# Patient Record
Sex: Male | Born: 1965 | Race: White | Hispanic: No | Marital: Single | Smoking: Never smoker
Health system: Southern US, Community
[De-identification: ages and names within clinical notes are randomized; demographics above are authoritative.]

## PROBLEM LIST (undated history)

## (undated) DIAGNOSIS — T7840XA Allergy, unspecified, initial encounter: Secondary | ICD-10-CM

## (undated) DIAGNOSIS — C801 Malignant (primary) neoplasm, unspecified: Secondary | ICD-10-CM

## (undated) DIAGNOSIS — K219 Gastro-esophageal reflux disease without esophagitis: Secondary | ICD-10-CM

## (undated) HISTORY — PX: EYE SURGERY: SHX253

## (undated) HISTORY — DX: Gastro-esophageal reflux disease without esophagitis: K21.9

## (undated) HISTORY — DX: Allergy, unspecified, initial encounter: T78.40XA

## (undated) HISTORY — DX: Malignant (primary) neoplasm, unspecified: C80.1

---

## 2018-04-26 ENCOUNTER — Emergency Department (HOSPITAL_BASED_OUTPATIENT_CLINIC_OR_DEPARTMENT_OTHER): Payer: 59

## 2018-04-26 ENCOUNTER — Emergency Department (HOSPITAL_BASED_OUTPATIENT_CLINIC_OR_DEPARTMENT_OTHER)
Admission: EM | Admit: 2018-04-26 | Discharge: 2018-04-26 | Disposition: A | Discharge status: Home / Self Care | Payer: 59 | Attending: Emergency Medicine | Admitting: Emergency Medicine

## 2018-04-26 ENCOUNTER — Encounter (HOSPITAL_BASED_OUTPATIENT_CLINIC_OR_DEPARTMENT_OTHER): Payer: Self-pay | Admitting: Emergency Medicine

## 2018-04-26 ENCOUNTER — Other Ambulatory Visit: Payer: Self-pay

## 2018-04-26 DIAGNOSIS — R1030 Lower abdominal pain, unspecified: Secondary | ICD-10-CM | POA: Diagnosis not present

## 2018-04-26 DIAGNOSIS — M545 Low back pain, unspecified: Secondary | ICD-10-CM

## 2018-04-26 DIAGNOSIS — F1210 Cannabis abuse, uncomplicated: Secondary | ICD-10-CM | POA: Diagnosis not present

## 2018-04-26 DIAGNOSIS — M5126 Other intervertebral disc displacement, lumbar region: Secondary | ICD-10-CM | POA: Insufficient documentation

## 2018-04-26 LAB — BASIC METABOLIC PANEL
Anion gap: 9 (ref 5–15)
BUN: 9 mg/dL (ref 6–20)
CO2: 23 mmol/L (ref 22–32)
Calcium: 9.1 mg/dL (ref 8.9–10.3)
Chloride: 105 mmol/L (ref 98–111)
Creatinine, Ser: 0.88 mg/dL (ref 0.61–1.24)
GFR calc Af Amer: >60 mL/min (ref >60)
GFR calc non Af Amer: >60 mL/min (ref >60)
Glucose, Bld: 86 mg/dL (ref 70–99)
Potassium: 3.7 mmol/L (ref 3.5–5.1)
Sodium: 137 mmol/L (ref 135–145)

## 2018-04-26 LAB — CBC WITH DIFFERENTIAL/PLATELET
Abs Immature Granulocytes: 0.04 10*3/uL (ref 0.00–0.07)
Basophils Absolute: 0.1 10*3/uL (ref 0.0–0.1)
Basophils Relative: 1 %
Eosinophils Absolute: 0 10*3/uL (ref 0.0–0.5)
Eosinophils Relative: 0 %
HCT: 47.2 % (ref 39.0–52.0)
Hemoglobin: 15.4 g/dL (ref 13.0–17.0)
Immature Granulocytes: 1 %
Lymphocytes Relative: 11 %
Lymphs Abs: 0.9 10*3/uL (ref 0.7–4.0)
MCH: 30.1 pg (ref 26.0–34.0)
MCHC: 32.6 g/dL (ref 30.0–36.0)
MCV: 92.4 fL (ref 80.0–100.0)
Monocytes Absolute: 1.1 10*3/uL — ABNORMAL HIGH (ref 0.1–1.0)
Monocytes Relative: 13 %
Neutro Abs: 5.9 10*3/uL (ref 1.7–7.7)
Neutrophils Relative %: 74 %
Platelets: 246 10*3/uL (ref 150–400)
RBC: 5.11 MIL/uL (ref 4.22–5.81)
RDW: 12.2 % (ref 11.5–15.5)
WBC: 8 10*3/uL (ref 4.0–10.5)
nRBC: 0 % (ref 0.0–0.2)

## 2018-04-26 LAB — URINALYSIS, ROUTINE W REFLEX MICROSCOPIC
Bilirubin Urine: NEGATIVE
Glucose, UA: NEGATIVE mg/dL
HGB URINE DIPSTICK: NEGATIVE
Ketones, ur: NEGATIVE mg/dL
Leukocytes, UA: NEGATIVE
Nitrite: NEGATIVE
Protein, ur: NEGATIVE mg/dL
Specific Gravity, Urine: <1.005 — ABNORMAL LOW (ref 1.005–1.030)
pH: 5.5 (ref 5.0–8.0)

## 2018-04-26 MED ORDER — IBUPROFEN 600 MG PO TABS: 600.0000 mg | ORAL_TABLET | Freq: Four times a day (QID) | ORAL | 0 refills | Status: DC | PRN

## 2018-04-26 MED ORDER — METHOCARBAMOL 750 MG PO TABS: 750.0000 mg | ORAL_TABLET | Freq: Two times a day (BID) | ORAL | 0 refills | Status: DC

## 2018-04-26 NOTE — ED Triage Notes (Signed)
Pain in right flank radiating into right groin and down right leg since last night.  Has been having occasional twinges of pain in right flank for a couple months, but nothing like this.

## 2018-04-26 NOTE — ED Notes (Signed)
Patient transported to CT 

## 2018-04-26 NOTE — Discharge Instructions (Addendum)
Take ibuprofen every 6 hours for your pain.  You can alternate with Tylenol as prescribed over-the-counter.  Take Robaxin twice daily as needed for muscle pain or spasms.  Do not drive or operate machinery while taking this medication.  Use ice 3-4 times daily alternating 20 minutes on, 20 minutes off.  Please follow-up with a primary care provider by calling the number circled on your discharge paperwork.  I have also attached to a neurosurgeon to follow-up with if your symptoms are continuing.

## 2018-04-26 NOTE — ED Provider Notes (Signed)
[facility] Provider Note   CSN: [id number] Arrival date & time: 04/26/18  1042     History   Chief Complaint Chief Complaint  Patient presents with  . flank and groin pain    HPI Johnny Cook is a 53 y.o. male who is previously healthy who presents with right low back pain that began last night.  It woke him up out of sleep.  He reports it is worse when he lays on his right side or make certain movements.  He has radiation into his right groin and down the front of his right leg toward his knee.  He reports he has had occasional twinges of pain in his right low back for couple months, but nothing like this.  He reports doing heavy lifting at work.  He denies any numbness or tingling, saddle anesthesia, bowel or bladder incontinence, fevers, history of cancer, procedure to back, IVDU.  He has not taken any medications at home for symptoms.  HPI  History reviewed. No pertinent past medical history.  There are no active problems to display for this patient.   History reviewed. No pertinent surgical history.      Home Medications    Prior to Admission medications   Medication Sig Start Date End Date Taking? Authorizing Provider  ibuprofen (ADVIL,MOTRIN) 600 MG tablet Take 1 tablet (600 mg total) by mouth every 6 (six) hours as needed. 04/26/18   Rahim Astorga, Bea Graff, PA-C  methocarbamol (ROBAXIN) 750 MG tablet Take 1 tablet (750 mg total) by mouth 2 (two) times daily. 04/26/18   Frederica Kuster, PA-C    Family History No family history on file.  Social History Social History   Tobacco Use  . Smoking status: Never Smoker  . Smokeless tobacco: Never Used  Substance Use Topics  . Alcohol use: Yes    Comment: 3-4 beers per day  . Drug use: Yes    Types: Marijuana     Allergies   Patient has no known allergies.   Review of Systems Review of Systems  Constitutional: Negative for chills and fever.  HENT: Negative for facial swelling  and sore throat.   Respiratory: Negative for shortness of breath.   Cardiovascular: Negative for chest pain.  Gastrointestinal: Negative for abdominal pain, nausea and vomiting.  Genitourinary: Negative for discharge, dysuria, frequency, hematuria, penile pain, scrotal swelling, testicular pain and urgency.  Musculoskeletal: Positive for back pain.  Skin: Negative for rash and wound.  Neurological: Negative for numbness and headaches.  Psychiatric/Behavioral: The patient is not nervous/anxious.      Physical Exam Updated Vital Signs BP (!) 141/98 (BP Location: Left Arm)   Pulse 74   Temp 98.2 F (36.8 C) (Oral)   Resp 16   Ht 5\' 9"  (1.753 m)   Wt 74.8 kg   SpO2 98%   BMI 24.37 kg/m   Physical Exam Vitals signs and nursing note reviewed. Exam conducted with a chaperone present.  Constitutional:      General: He is not in acute distress.    Appearance: He is well-developed. He is not diaphoretic.  HENT:     Head: Normocephalic and atraumatic.     Mouth/Throat:     Pharynx: No oropharyngeal exudate.  Eyes:     General: No scleral icterus.       Right eye: No discharge.        Left eye: No discharge.     Conjunctiva/sclera: Conjunctivae normal.  Pupils: Pupils are equal, round, and reactive to light.  Neck:     Musculoskeletal: Normal range of motion and neck supple.     Thyroid: No thyromegaly.  Cardiovascular:     Rate and Rhythm: Normal rate and regular rhythm.     Heart sounds: Normal heart sounds. No murmur. No friction rub. No gallop.   Pulmonary:     Effort: Pulmonary effort is normal. No respiratory distress.     Breath sounds: Normal breath sounds. No stridor. No wheezing or rales.  Abdominal:     General: Bowel sounds are normal. There is no distension.     Palpations: Abdomen is soft. There is no pulsatile mass.     Tenderness: There is no abdominal tenderness. There is no right CVA tenderness, left CVA tenderness, guarding or rebound.  Genitourinary:     Penis: Normal.      Scrotum/Testes: Normal.  Musculoskeletal:     Comments: No tenderness on palpation of the cervical, thoracic, lumbar spine, surrounding musculature, right hip, or leg  Lymphadenopathy:     Cervical: No cervical adenopathy.  Skin:    General: Skin is warm and dry.     Coloration: Skin is not pale.     Findings: No rash.  Neurological:     Mental Status: He is alert.     Coordination: Coordination normal.     Comments: Sensation intact, 5/5 strength to bilateral lower extremities, 2+ patellar reflexes      ED Treatments / Results  Labs (all labs ordered are listed, but only abnormal results are displayed) Labs Reviewed  URINALYSIS, ROUTINE W REFLEX MICROSCOPIC - Abnormal; Notable for the following components:      Result Value   Color, Urine STRAW (*)    Specific Gravity, Urine <1.005 (*)    All other components within normal limits  CBC WITH DIFFERENTIAL/PLATELET - Abnormal; Notable for the following components:   Monocytes Absolute 1.1 (*)    All other components within normal limits  BASIC METABOLIC PANEL    EKG None  Radiology Ct L-spine No Charge  Result Date: 04/26/2018 CLINICAL DATA:  Right low back pain radiating to the groin and right leg. EXAM: CT LUMBAR SPINE WITHOUT CONTRAST TECHNIQUE: Multidetector CT imaging of the lumbar spine was performed without intravenous contrast administration. Multiplanar CT image reconstructions were also generated. COMPARISON:  None. FINDINGS: Segmentation: 5 lumbar type vertebral bodies. Alignment: Degenerative anterolisthesis at L5-S1 of 5-6 mm. Vertebrae: No fracture or primary bone lesion. Paraspinal and other soft tissues: Aortic atherosclerosis. Disc levels: No significant finding at L2-3 or above. L3-4: Mild bulging of the disc.  No likely compressive stenosis. L4-5: Bulging of the disc with prominence in the right foraminal to extraforaminal region. There is some potential this could affect the right L4 nerve.  L5-S1: Chronic facet arthropathy with anterolisthesis 5-6 mm. Chronic disc degeneration with loss of disc height and vacuum phenomenon. Stenosis of the subarticular lateral recesses and neural foramina that could cause neural compression on either or both sides. IMPRESSION: Note that the sagittal images go left to right. This could lead to left-to-right confusion. Right foraminal to extraforaminal disc protrusion at L4-5 that could focally affect the right L4 nerve. L5-S1 bilateral facet arthropathy with degenerative anterolisthesis of 5-6 mm. Disc degeneration and bulging. Stenosis of the subarticular lateral recesses and neural foramina that could cause L5 or S1 nerve compression on either side. Electronically Signed   By: Nelson Chimes M.D.   On: 04/26/2018 14:47  Ct Renal Stone Study  Result Date: 04/26/2018 CLINICAL DATA:  Acute right flank pain. EXAM: CT ABDOMEN AND PELVIS WITHOUT CONTRAST TECHNIQUE: Multidetector CT imaging of the abdomen and pelvis was performed following the standard protocol without IV contrast. COMPARISON:  None. FINDINGS: Lower chest: No acute abnormality. Hepatobiliary: No gallstones or biliary dilatation is noted. Fatty infiltration of the liver is noted. Pancreas: Unremarkable. No pancreatic ductal dilatation or surrounding inflammatory changes. Spleen: Normal in size without focal abnormality. Adrenals/Urinary Tract: Adrenal glands are unremarkable. Kidneys are normal, without renal calculi, focal lesion, or hydronephrosis. Bladder is unremarkable. Stomach/Bowel: Stomach is within normal limits. Appendix appears normal. No evidence of bowel wall thickening, distention, or inflammatory changes. Vascular/Lymphatic: Aortic atherosclerosis. No enlarged abdominal or pelvic lymph nodes. Reproductive: Prostate is unremarkable. Other: No abdominal wall hernia or abnormality. No abdominopelvic ascites. Musculoskeletal: No acute or significant osseous findings. IMPRESSION: Hepatic  steatosis. No hydronephrosis or renal obstruction is noted. No renal or ureteral calculi are noted. Aortic Atherosclerosis (ICD10-I70.0). Electronically Signed   By: Marijo Conception, M.D.   On: 04/26/2018 12:26    Procedures Procedures (including critical care time)  Medications Ordered in ED Medications - No data to display   Initial Impression / Assessment and Plan / ED Course  I have reviewed the triage vital signs and the nursing notes.  Pertinent labs & imaging results that were available during my care of the patient were reviewed by me and considered in my medical decision making (see chart for details).     Patient presenting with probable radicular back pain.  I am unable to reproduce the pain, however he does not currently have significant pain on my exam.  CT renal stone is negative for acute findings. Patient made aware of his hepatic steatosis and aortic atherosclerosis.   CT lumbar reformatting shows right foraminal to extraforaminal disc protrusion at L4-5 that could focally affect the right L4 nerve; L5-S1 bilateral facet arthropathy with degenerative anterolisthesis of 5 to 6 mm; disc degeneration and bulging; stenosis of the subarticular lateral recesses and neural foramina that could cause L5 or S1 nerve compression on either side.  Patient is neurovascularly intact.  He has had minimal pain during his ED course.  It is only worse with certain movements and positions.  Patient has no abdominal tenderness.  No pulsatile mass noted.  Labs are unremarkable.  Patient will be sent home with NSAIDs, Robaxin.  He is advised to follow-up with a PCP.  He will also given follow-up to neurosurgery as needed.  Advised ice and heat.  Strict return precautions discussed.  Patient understands and agrees with plan.  Patient vitals stable throughout ED course and discharged in satisfactory condition. I discussed patient case with Dr. Sherry Ruffing who guided the patient's management and agrees with  plan.   Final Clinical Impressions(s) / ED Diagnoses   Final diagnoses:  Low back pain  Protrusion of lumbar intervertebral disc    ED Discharge Orders         Ordered    ibuprofen (ADVIL,MOTRIN) 600 MG tablet  Every 6 hours PRN     04/26/18 1510    methocarbamol (ROBAXIN) 750 MG tablet  2 times daily     04/26/18 [address] [place] 04/26/18 2010    Tegeler, Gwenyth Allegra, MD 04/27/18 [phone]

## 2018-12-05 ENCOUNTER — Other Ambulatory Visit: Payer: Self-pay

## 2018-12-05 DIAGNOSIS — Z20822 Contact with and (suspected) exposure to covid-19: Secondary | ICD-10-CM

## 2018-12-06 LAB — NOVEL CORONAVIRUS, NAA: SARS-CoV-2, NAA: NOT DETECTED

## 2020-06-18 IMAGING — CT CT L SPINE W/O CM
4 of 8 series · 14 of 33 positions shown, 15 images · non-contrast
Comparison: None.

CLINICAL DATA: Right low back pain radiating to the groin and right
leg.

EXAM:
CT LUMBAR SPINE WITHOUT CONTRAST
TECHNIQUE: Multidetector CT imaging of the lumbar spine was performed without
intravenous contrast administration. Multiplanar CT image
reconstructions were also generated.

[Series 2: axial st · axial · 0.81mm/px · z∈[-452,-192]mm · 3 of 106 slices shown, 4 images]
[im 27/106  soft-tissue]
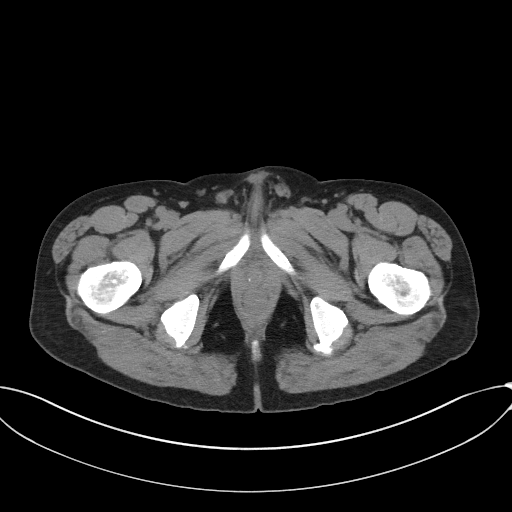
[im 27/106  bone]
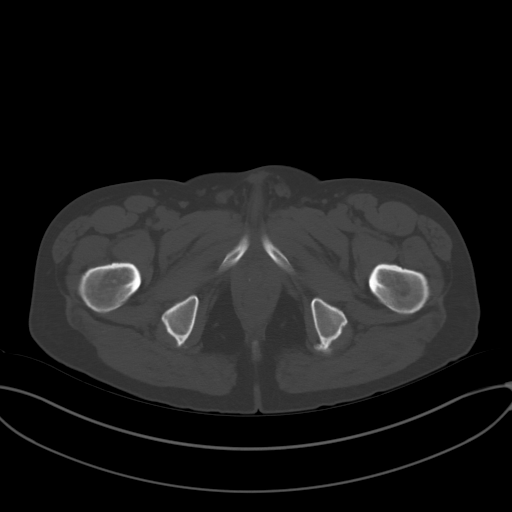
[im 53/106  bone]
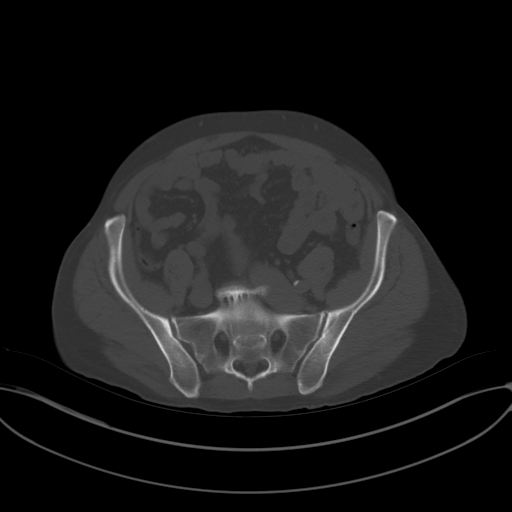
[im 79/106  bone]
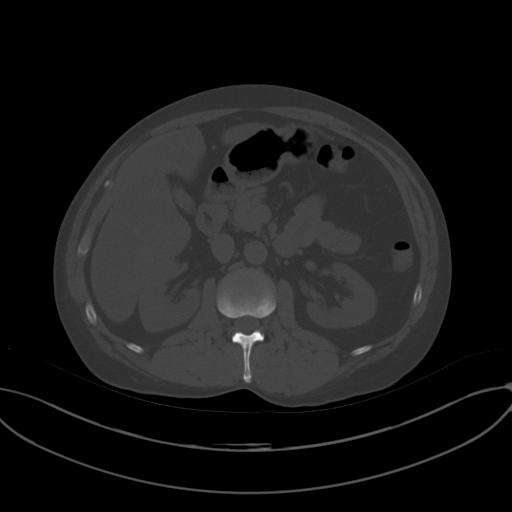

[Series 4: coronal st · coronal · 0.81mm/px · 2 of 89 slices shown]
[im 30/89  bone]
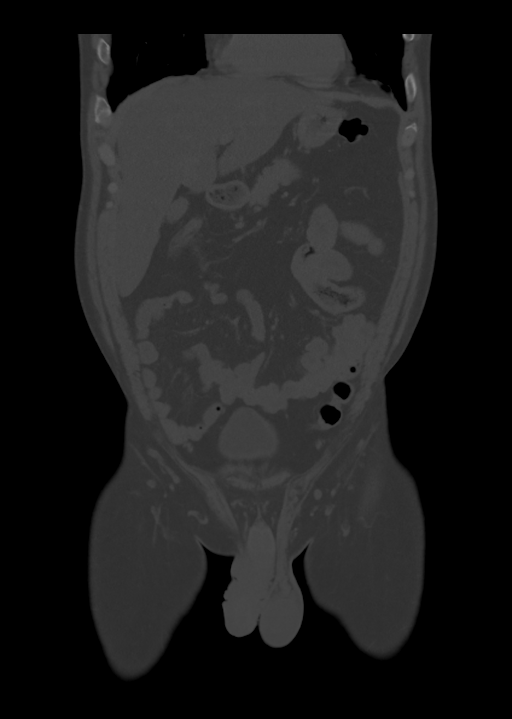
[im 59/89  bone]
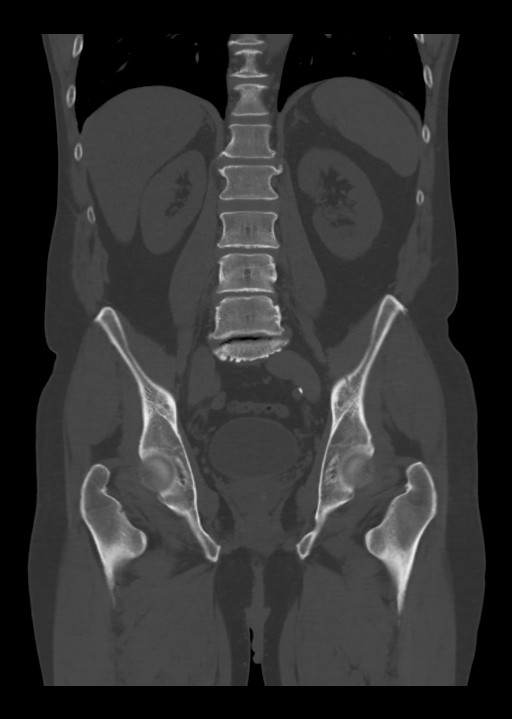

[Series 5: sagittal st · sagittal · 0.63mm/px · 5 of 117 slices shown]
[im 17/117  bone]
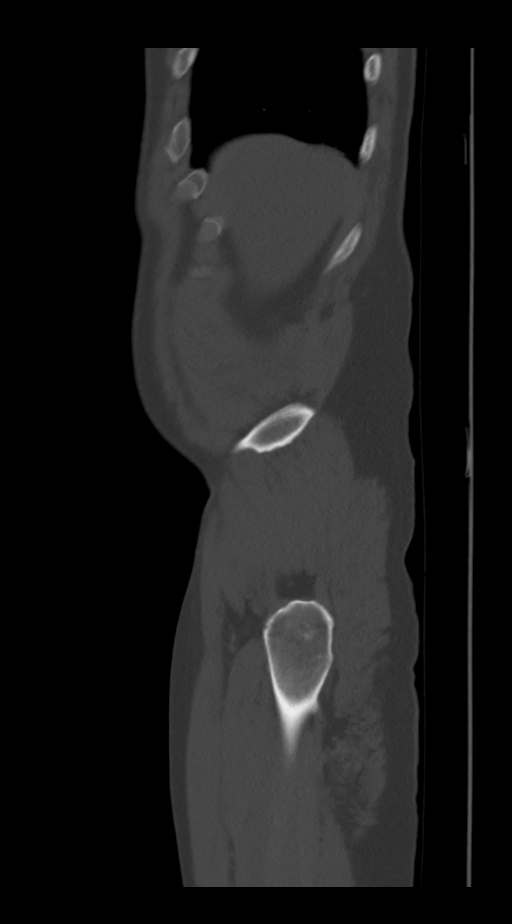
[im 34/117  bone]
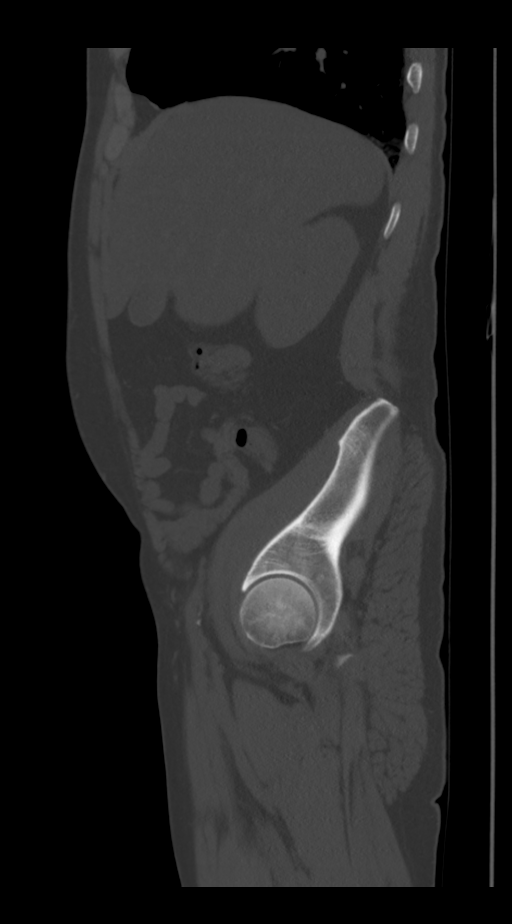
[im 50/117  bone]
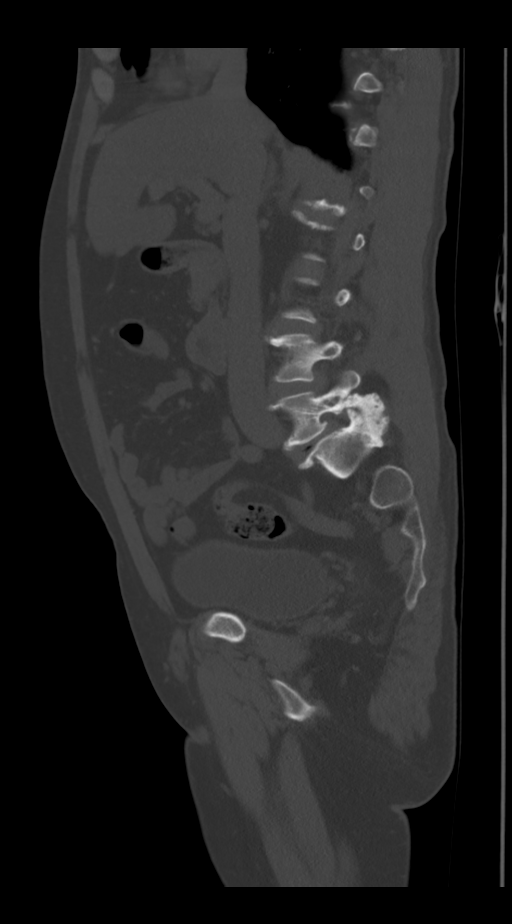
[im 67/117  bone]
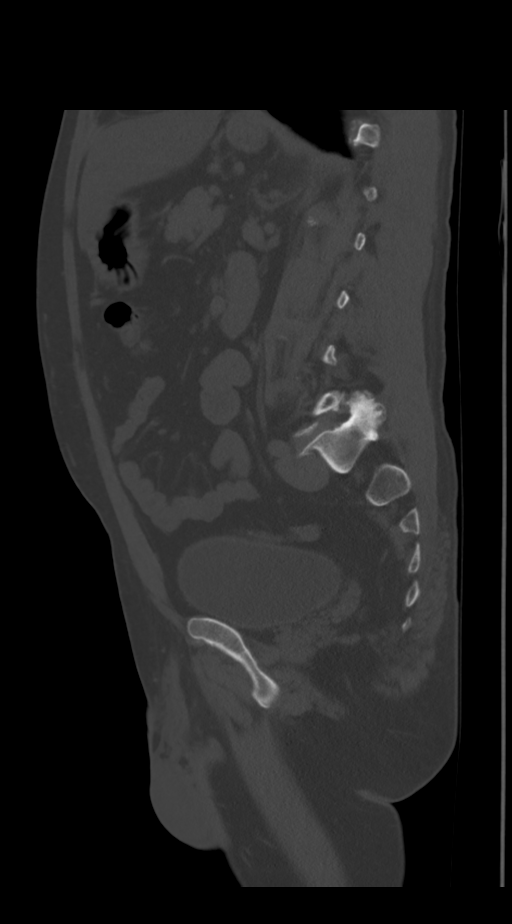
[im 83/117  bone]
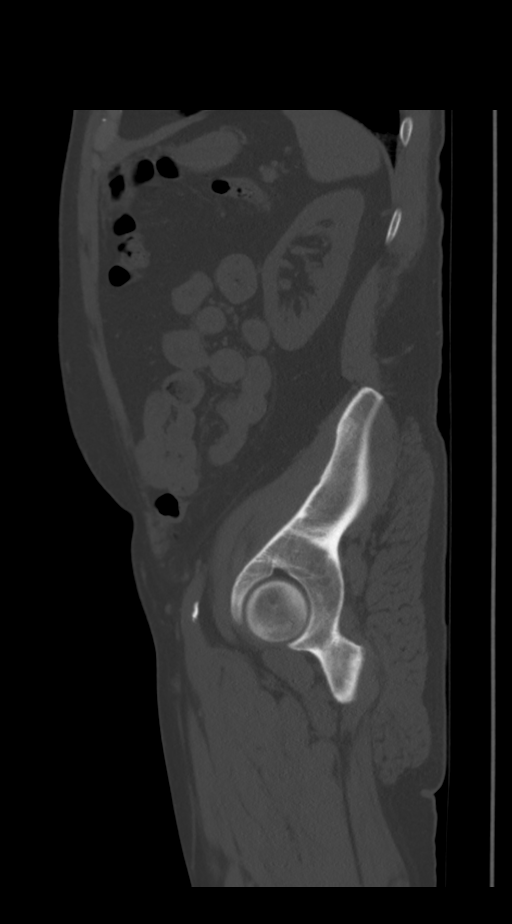

[Series 12: axial soft l/sp · axial · 0.30mm/px · z∈[-330,-156]mm · 4 of 147 slices shown]
[im 30/147  soft-tissue]
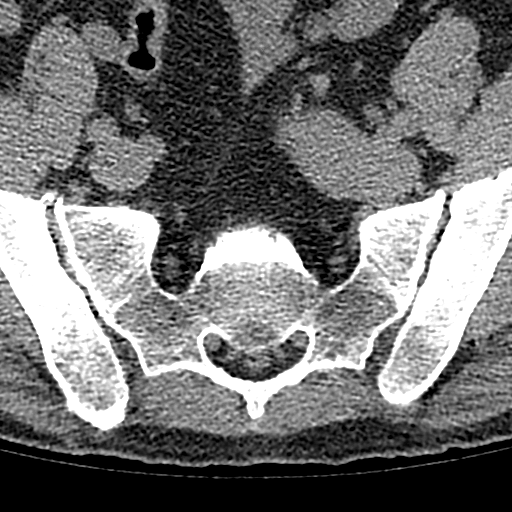
[im 59/147  soft-tissue]
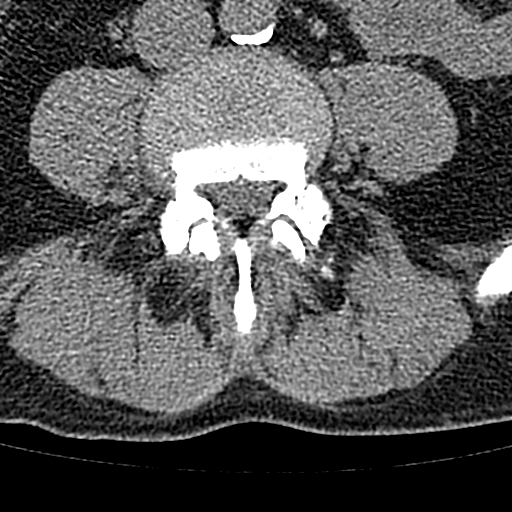
[im 88/147  soft-tissue]
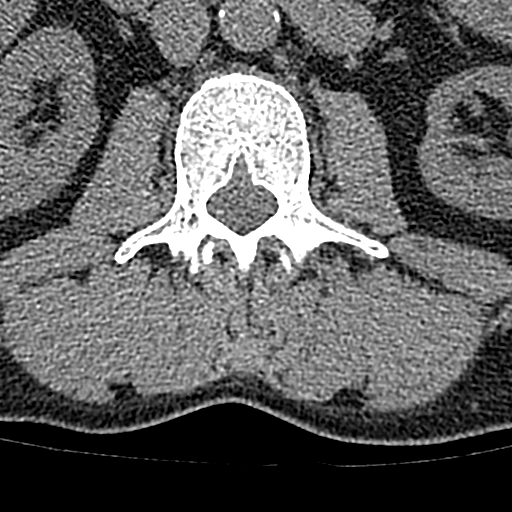
[im 117/147  soft-tissue]
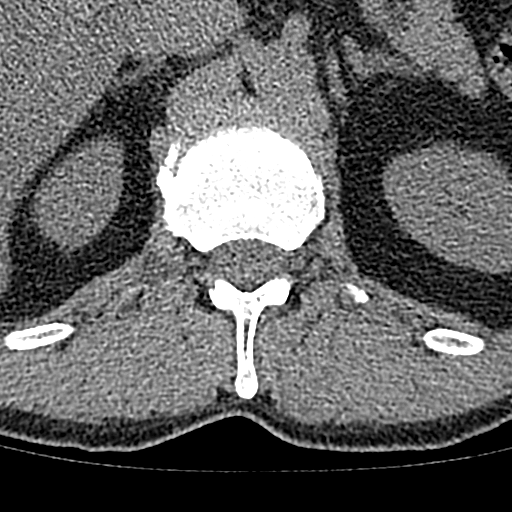

[14 of 33 positions shown; findings below may reference images not displayed]

FINDINGS: Segmentation: 5 lumbar type vertebral bodies.

Alignment: Degenerative anterolisthesis at L5-S1 of 5-6 mm.

Vertebrae: No fracture or primary bone lesion.

Paraspinal and other soft tissues: Aortic atherosclerosis.

Disc levels: No significant finding at L2-3 or above.

L3-4: Mild bulging of the disc.  No likely compressive stenosis.

L4-5: Bulging of the disc with prominence in the right foraminal to
extraforaminal region. There is some potential this could affect the
right L4 nerve.

L5-S1: Chronic facet arthropathy with anterolisthesis 5-6 mm.
Chronic disc degeneration with loss of disc height and vacuum
phenomenon. Stenosis of the subarticular lateral recesses and neural
foramina that could cause neural compression on either or both
sides.
IMPRESSION: Note that the sagittal images go left to right. This could lead to
left-to-right confusion.

Right foraminal to extraforaminal disc protrusion at L4-5 that could
focally affect the right L4 nerve.

L5-S1 bilateral facet arthropathy with degenerative anterolisthesis
of 5-6 mm. Disc degeneration and bulging. Stenosis of the
subarticular lateral recesses and neural foramina that could cause
L5 or S1 nerve compression on either side.

## 2021-03-27 ENCOUNTER — Encounter: Payer: Self-pay | Admitting: Emergency Medicine

## 2021-03-27 ENCOUNTER — Ambulatory Visit (INDEPENDENT_AMBULATORY_CARE_PROVIDER_SITE_OTHER): Payer: BC Managed Care – PPO

## 2021-03-27 ENCOUNTER — Other Ambulatory Visit: Payer: Self-pay

## 2021-03-27 ENCOUNTER — Ambulatory Visit
Admission: EM | Admit: 2021-03-27 | Discharge: 2021-03-27 | Disposition: A | Discharge status: Home / Self Care | Payer: BC Managed Care – PPO | Attending: Physician Assistant | Admitting: Physician Assistant

## 2021-03-27 DIAGNOSIS — M19072 Primary osteoarthritis, left ankle and foot: Secondary | ICD-10-CM | POA: Diagnosis not present

## 2021-03-27 DIAGNOSIS — M79672 Pain in left foot: Secondary | ICD-10-CM

## 2021-03-27 LAB — POCT FASTING CBG KUC MANUAL ENTRY: POCT Glucose (KUC): 124 mg/dL — AB (ref 70–99)

## 2021-03-27 MED ORDER — PREDNISONE 20 MG PO TABS: 40.0000 mg | ORAL_TABLET | Freq: Every day | ORAL | 0 refills | Status: AC

## 2021-03-27 NOTE — ED Triage Notes (Signed)
Patient c/o left foot pain/numbness x 2 weeks, no apparent injury.  No history of diabetes.

## 2021-03-27 NOTE — ED Provider Notes (Signed)
[facility]    CSN: [id number] Arrival date & time: 03/27/21  1530      History   Chief Complaint Chief Complaint  Patient presents with   Foot Pain    HPI GRAM Johnny Cook is a 55 y.o. male.   Patient here today for evaluation of pain in his left foot that started about 2 weeks ago.  He states that weightbearing will make pain worse.  Sometimes he will have some numbness that occurs in his foot as well but this is not constant.  He has not had any injury to his foot that he is aware of.  He does report a fracture to same foot when he was in high school.   The history is provided by the patient.  Foot Pain Pertinent negatives include no shortness of breath.   History reviewed. No pertinent past medical history.  There are no problems to display for this patient.   History reviewed. No pertinent surgical history.     Home Medications    Prior to Admission medications   Medication Sig Start Date End Date Taking? Authorizing Provider  predniSONE (DELTASONE) 20 MG tablet Take 2 tablets (40 mg total) by mouth daily with breakfast for 5 days. 03/27/21 04/01/21 Yes Francene Finders, PA-C  ibuprofen (ADVIL,MOTRIN) 600 MG tablet Take 1 tablet (600 mg total) by mouth every 6 (six) hours as needed. 04/26/18   Law, Bea Graff, PA-C  methocarbamol (ROBAXIN) 750 MG tablet Take 1 tablet (750 mg total) by mouth 2 (two) times daily. 04/26/18   Frederica Kuster, PA-C    Family History History reviewed. No pertinent family history.  Social History Social History   Tobacco Use   Smoking status: Never   Smokeless tobacco: Never  Substance Use Topics   Alcohol use: Yes    Comment: 3-4 beers per day   Drug use: Yes    Types: Marijuana     Allergies   Patient has no known allergies.   Review of Systems Review of Systems  Constitutional:  Negative for chills and fever.  Eyes:  Negative for discharge and redness.  Respiratory:  Negative for shortness of  breath.   Musculoskeletal:  Positive for arthralgias. Negative for joint swelling.  Skin:  Negative for color change and wound.  Neurological:  Positive for numbness.    Physical Exam Triage Vital Signs ED Triage Vitals  Enc Vitals Group     BP 03/27/21 1617 109/75     Pulse Rate 03/27/21 1617 99     Resp --      Temp 03/27/21 1617 98.1 F (36.7 C)     Temp Source 03/27/21 1617 Oral     SpO2 03/27/21 1617 93 %     Weight 03/27/21 1618 170 lb (77.1 kg)     Height 03/27/21 1618 5\' 9"  (1.753 m)     Head Circumference --      Peak Flow --      Pain Score 03/27/21 1618 7     Pain Loc --      Pain Edu? --      Excl. in [place]? --    No data found.  Updated Vital Signs BP 109/75 (BP Location: Left Arm)    Pulse 99    Temp 98.1 F (36.7 C) (Oral)    Ht 5\' 9"  (1.753 m)    Wt 170 lb (77.1 kg)    SpO2 93%    BMI 25.10 kg/m  Physical Exam Vitals and nursing note reviewed.  Constitutional:      General: He is not in acute distress.    Appearance: Normal appearance. He is not ill-appearing.  HENT:     Head: Normocephalic and atraumatic.  Eyes:     Conjunctiva/sclera: Conjunctivae normal.  Cardiovascular:     Rate and Rhythm: Normal rate.  Pulmonary:     Effort: Pulmonary effort is normal.  Musculoskeletal:     Comments: Full range of motion of left foot and ankle.  No apparent swelling appreciated.  Skin:    Capillary Refill: Normal cap refill to left toes. Neurological:     Mental Status: He is alert.     Comments: Sensation intact to left toes  Psychiatric:        Mood and Affect: Mood normal.        Behavior: Behavior normal.        Thought Content: Thought content normal.     UC Treatments / Results  Labs (all labs ordered are listed, but only abnormal results are displayed) Labs Reviewed  POCT FASTING CBG [facility] MANUAL ENTRY - Abnormal; Notable for the following components:      Result Value   POCT Glucose ([facility]) 124 (*)    All other components within normal  limits    EKG   Radiology DG Foot Complete Left  Result Date: 03/27/2021 CLINICAL DATA:  Left foot pain and numbness for 2 weeks EXAM: LEFT FOOT - COMPLETE 3+ VIEW COMPARISON:  None. FINDINGS: Frontal, oblique, and lateral views of the left foot are obtained. No fracture, subluxation, or dislocation. There is mild osteoarthritis at the tarsometatarsal joints, greatest at the first digit. Mild soft tissue swelling of the forefoot. IMPRESSION: 1. Mild forefoot soft tissue swelling. 2. 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 Electronically Signed   By: Randa Ngo M.D.   On: 03/27/2021 17:36    Procedures Procedures (including critical care time)  Medications Ordered in UC Medications - No data to display  Initial Impression / Assessment and Plan / UC Course  I have reviewed the triage vital signs and the nursing notes.  Pertinent labs & imaging results that were available during my care of the patient were reviewed by me and considered in my medical decision making (see chart for details).    Glucose within normal limits in office.  X-ray also without concerning findings.  Will treat with prednisone to cover any inflammatory cause of pain but recommended follow-up if no improvement or if symptoms worsen.  Final Clinical Impressions(s) / UC Diagnoses   Final diagnoses:  Left foot pain   Discharge Instructions   None    ED Prescriptions     Medication Sig Dispense Auth. Provider   predniSONE (DELTASONE) 20 MG tablet Take 2 tablets (40 mg total) by mouth daily with breakfast for 5 days. 10 tablet Francene Finders, PA-C      PDMP not reviewed this encounter.   Francene Finders, PA-C 03/27/21 1927

## 2021-10-07 DIAGNOSIS — H2522 Age-related cataract, morgagnian type, left eye: Secondary | ICD-10-CM | POA: Diagnosis not present

## 2021-10-07 DIAGNOSIS — H25811 Combined forms of age-related cataract, right eye: Secondary | ICD-10-CM | POA: Diagnosis not present

## 2021-11-20 DIAGNOSIS — H268 Other specified cataract: Secondary | ICD-10-CM | POA: Diagnosis not present

## 2021-11-20 DIAGNOSIS — H25811 Combined forms of age-related cataract, right eye: Secondary | ICD-10-CM | POA: Diagnosis not present

## 2021-12-03 DIAGNOSIS — H2512 Age-related nuclear cataract, left eye: Secondary | ICD-10-CM | POA: Diagnosis not present

## 2021-12-04 DIAGNOSIS — H52222 Regular astigmatism, left eye: Secondary | ICD-10-CM | POA: Diagnosis not present

## 2021-12-04 DIAGNOSIS — H2589 Other age-related cataract: Secondary | ICD-10-CM | POA: Diagnosis not present

## 2022-01-05 ENCOUNTER — Ambulatory Visit
Admission: EM | Admit: 2022-01-05 | Discharge: 2022-01-05 | Disposition: A | Discharge status: Home / Self Care | Payer: BC Managed Care – PPO | Attending: Physician Assistant | Admitting: Physician Assistant

## 2022-01-05 DIAGNOSIS — L0291 Cutaneous abscess, unspecified: Secondary | ICD-10-CM | POA: Diagnosis not present

## 2022-01-05 MED ORDER — DOXYCYCLINE HYCLATE 100 MG PO CAPS: 100.0000 mg | ORAL_CAPSULE | Freq: Two times a day (BID) | ORAL | 0 refills | Status: DC

## 2022-01-05 NOTE — ED Triage Notes (Signed)
Pt c/o pruritic lesion to medial right shin about 4 inches proximal from the ankle. Onset ~ 2 weeks ago. Reports nothing has drained from it, and it is not getting better. Denies pain

## 2022-01-06 ENCOUNTER — Encounter: Payer: Self-pay | Admitting: Physician Assistant

## 2022-01-06 NOTE — ED Provider Notes (Signed)
[facility]    CSN: [id number] Arrival date & time: 01/05/22  1713      History   Chief Complaint Chief Complaint  Patient presents with   Abscess    HPI Johnny Cook is a 56 y.o. male.   Patient here today for evaluation of a skin lesion to his right shin that he first noticed 2 weeks ago.  He reports that he has not any drainage but has mild itching.  He denies any pain. Possible mild increase in size but overall no changes. He is not sure what caused skin lesion.  He does not report treatment.  The history is provided by the patient.  Abscess Associated symptoms: no fever     History reviewed. No pertinent past medical history.  There are no problems to display for this patient.   History reviewed. No pertinent surgical history.     Home Medications    Prior to Admission medications   Medication Sig Start Date End Date Taking? Authorizing Provider  doxycycline (VIBRAMYCIN) 100 MG capsule Take 1 capsule (100 mg total) by mouth 2 (two) times daily. 01/05/22  Yes Francene Finders, PA-C  ibuprofen (ADVIL,MOTRIN) 600 MG tablet Take 1 tablet (600 mg total) by mouth every 6 (six) hours as needed. 04/26/18   Law, Bea Graff, PA-C  methocarbamol (ROBAXIN) 750 MG tablet Take 1 tablet (750 mg total) by mouth 2 (two) times daily. 04/26/18   Frederica Kuster, PA-C    Family History History reviewed. No pertinent family history.  Social History Social History   Tobacco Use   Smoking status: Never   Smokeless tobacco: Never  Substance Use Topics   Alcohol use: Yes    Comment: 3-4 beers per day   Drug use: Yes    Types: Marijuana     Allergies   Patient has no known allergies.   Review of Systems Review of Systems  Constitutional:  Negative for chills and fever.  Eyes:  Negative for discharge and redness.  Skin:  Positive for color change and wound.  Neurological:  Negative for numbness.     Physical Exam Triage Vital Signs ED Triage  Vitals [01/05/22 1839]  Enc Vitals Group     BP 135/80     Pulse Rate 90     Resp 16     Temp 98 F (36.7 C)     Temp Source Oral     SpO2 95 %     Weight      Height      Head Circumference      Peak Flow      Pain Score 0     Pain Loc      Pain Edu?      Excl. in [place]?    No data found.  Updated Vital Signs BP 135/80 (BP Location: Left Arm)   Pulse 90   Temp 98 F (36.7 C) (Oral)   Resp 16   SpO2 95%     Physical Exam Vitals and nursing note reviewed.  Constitutional:      General: He is not in acute distress.    Appearance: Normal appearance. He is not ill-appearing.  HENT:     Head: Normocephalic and atraumatic.  Eyes:     Conjunctiva/sclera: Conjunctivae normal.  Cardiovascular:     Rate and Rhythm: Normal rate.  Pulmonary:     Effort: Pulmonary effort is normal.  Skin:    Comments: Approx 2 CM annular raised erythematous  lesions to right anterior lower leg with no active bleeding, central scaling noted  Neurological:     Mental Status: He is alert.  Psychiatric:        Mood and Affect: Mood normal.        Behavior: Behavior normal.        Thought Content: Thought content normal.      UC Treatments / Results  Labs (all labs ordered are listed, but only abnormal results are displayed) Labs Reviewed - No data to display  EKG   Radiology No results found.  Procedures Procedures (including critical care time)  Medications Ordered in UC Medications - No data to display  Initial Impression / Assessment and Plan / UC Course  I have reviewed the triage vital signs and the nursing notes.  Pertinent labs & imaging results that were available during my care of the patient were reviewed by me and considered in my medical decision making (see chart for details).    We will treat to cover abscess and appointment made with primary care for follow-up.  I do have some suspicion that this may be a cancerous lesion and discussed same with patient.   Encouraged sooner follow-up with any further concerns.  Final Clinical Impressions(s) / UC Diagnoses   Final diagnoses:  Abscess   Discharge Instructions   None    ED Prescriptions     Medication Sig Dispense Auth. Provider   doxycycline (VIBRAMYCIN) 100 MG capsule Take 1 capsule (100 mg total) by mouth 2 (two) times daily. 20 capsule Francene Finders, PA-C      PDMP not reviewed this encounter.   Francene Finders, PA-C 01/06/22 [id number]

## 2022-01-14 ENCOUNTER — Ambulatory Visit: Payer: BC Managed Care – PPO | Admitting: Nurse Practitioner

## 2022-01-14 ENCOUNTER — Encounter: Payer: Self-pay | Admitting: Nurse Practitioner

## 2022-01-14 VITALS — BP 122/86 | HR 92 | Temp 98.1°F | Ht 69.0 in | Wt 168.3 lb

## 2022-01-14 DIAGNOSIS — Z1211 Encounter for screening for malignant neoplasm of colon: Secondary | ICD-10-CM | POA: Diagnosis not present

## 2022-01-14 DIAGNOSIS — F1010 Alcohol abuse, uncomplicated: Secondary | ICD-10-CM

## 2022-01-14 DIAGNOSIS — L989 Disorder of the skin and subcutaneous tissue, unspecified: Secondary | ICD-10-CM

## 2022-01-14 DIAGNOSIS — F1290 Cannabis use, unspecified, uncomplicated: Secondary | ICD-10-CM

## 2022-01-14 DIAGNOSIS — Z139 Encounter for screening, unspecified: Secondary | ICD-10-CM

## 2022-01-14 HISTORY — DX: Alcohol abuse, uncomplicated: F10.10

## 2022-01-14 NOTE — Assessment & Plan Note (Signed)
First noticed about 2 weeks ago Lesion is improved with doxycycline Continue doxycycline 100 mg twice daily Patient referred to dermatologist due to his family history of skin cancer

## 2022-01-14 NOTE — Progress Notes (Signed)
New Patient Office Visit  Subjective:  Patient ID: Johnny Cook, male    DOB: 1965-07-11  Age: 56 y.o. MRN: [id number]  CC:  Chief Complaint  Patient presents with   Establish Care    Pt is here to est care. Pt states he has a skin sore on his right lower leg, requesting biopsy for same     HPI Johnny Cook is a 56 y.o. male with no significant past medical history who presents for establishing care. Has no previous PCP.   Patient complains of skin lesion to his right shin that he first noticed about 2 weeks ago, site was initially red before he started taking doxycycline that was prescribed during his visit to the ER on 01/05/2022. skin lesion never had drainage or itching, Patient stated that the  lesion has  improved, no as red as before   his mother has history of skin cancer. Patient denies fever chills, body aches, insect bite, trauma. He never had this type of lesion before.   Due for colonoscopy referral sent to GI for screening colonoscopy.  Due for shingles vaccine, Tdap vaccine flu vaccine.  Flu vaccine was declined today.  Patient encouraged to consider getting all these vaccines he verbalized understanding.  Alcohol abuse.  Drinks 3-4 beer daily.  Patient denies abdominal pain.      History reviewed. No pertinent past medical history.  Past Surgical History:  Procedure Laterality Date   EYE SURGERY      Family History  Problem Relation Age of Onset   Skin cancer Mother    Brain cancer Father     Social History   Socioeconomic History   Marital status: Single    Spouse name: Not on file   Number of children: 1   Years of education: Not on file   Highest education level: Not on file  Occupational History   Not on file  Tobacco Use   Smoking status: Never   Smokeless tobacco: Never  Substance and Sexual Activity   Alcohol use: Yes    Comment: 3-4 beers per day   Drug use: Yes    Types: Marijuana    Comment: occasionally   Sexual  activity: Not on file  Other Topics Concern   Not on file  Social History Narrative   Live with his room mate   Social Determinants of Health   Financial Resource Strain: Not on file  Food Insecurity: Not on file  Transportation Needs: Not on file  Physical Activity: Not on file  Stress: Not on file  Social Connections: Not on file  Intimate Partner Violence: Not on file    ROS Review of Systems  Constitutional: Negative.  Negative for activity change, chills, diaphoresis, fatigue and fever.  Respiratory: Negative.  Negative for apnea, cough, choking, chest tightness, shortness of breath, wheezing and stridor.   Cardiovascular: Negative.  Negative for chest pain, palpitations and leg swelling.  Gastrointestinal: Negative.  Negative for abdominal pain.  Skin:  Positive for rash.  Neurological: Negative.  Negative for dizziness, facial asymmetry, light-headedness and headaches.  Psychiatric/Behavioral: Negative.  Negative for agitation, behavioral problems, confusion, decreased concentration and dysphoric mood.     Objective:   Today's Vitals: BP 122/86 (BP Location: Right Arm, Patient Position: Sitting, Cuff Size: Normal)   Pulse 92   Temp 98.1 F (36.7 C)   Ht _0  (1.753 m)   Wt 168 lb 4.8 oz (76.3 kg)   SpO2 97%   BMI  24.85 kg/m   Physical Exam Constitutional:      General: He is not in acute distress.    Appearance: He is not ill-appearing, toxic-appearing or diaphoretic.  Eyes:     General: No scleral icterus.       Right eye: No discharge.        Left eye: No discharge.     Extraocular Movements: Extraocular movements intact.     Conjunctiva/sclera: Conjunctivae normal.  Cardiovascular:     Rate and Rhythm: Normal rate and regular rhythm.     Pulses: Normal pulses.     Heart sounds: Normal heart sounds. No murmur heard.    No friction rub. No gallop.  Pulmonary:     Effort: Pulmonary effort is normal. No respiratory distress.     Breath sounds: Normal  breath sounds. No stridor. No wheezing, rhonchi or rales.  Chest:     Chest wall: No tenderness.  Abdominal:     Palpations: Abdomen is soft.     Tenderness: There is no abdominal tenderness.  Musculoskeletal:        General: No swelling, tenderness, deformity or signs of injury.     Right lower leg: No edema.     Left lower leg: No edema.  Skin:    General: Skin is warm and dry.     Capillary Refill: Capillary refill takes less than 2 seconds.     Coloration: Skin is not jaundiced or pale.     Findings: Lesion present. No bruising, erythema or rash.     Comments: Raised lesion with central scaling noted on right anterior lower leg, no redness, discoloration or discharge noted   Neurological:     Mental Status: He is alert and oriented to person, place, and time.     Cranial Nerves: No cranial nerve deficit.     Sensory: No sensory deficit.     Motor: No weakness.     Coordination: Coordination normal.     Gait: Gait normal.  Psychiatric:        Mood and Affect: Mood normal.        Behavior: Behavior normal.        Thought Content: Thought content normal.        Judgment: Judgment normal.     Assessment & Plan:   Problem List Items Addressed This Visit       Musculoskeletal and Integument   Skin lesion    First noticed about 2 weeks ago Lesion is improved with doxycycline Continue doxycycline 100 mg twice daily Patient referred to dermatologist due to his family history of skin cancer      Relevant Orders   Ambulatory referral to Dermatology     Other   Alcohol abuse, daily use    Drinks 3-4  Beer daily.  Need to quit drinking including risk of  liver disease,intoxication,  falls from intoxication discussed patient verbalized understanding.      Marijuana user    Uses  marijuana occasionally, risk of respiratory diseases  discussed with patient he verbalized understanding.       Other Visit Diagnoses     Screening for colon cancer    -  Primary   Relevant  Orders   Ambulatory referral to Gastroenterology   Screening due       Relevant Orders   HgB A1c   CBC with Differential   CMP14+EGFR   TSH   Lipid Panel   HIV antibody (with reflex)   Hepatitis C antibody  Outpatient Encounter Medications as of 01/14/2022  Medication Sig   doxycycline (VIBRAMYCIN) 100 MG capsule Take 1 capsule (100 mg total) by mouth 2 (two) times daily.   ibuprofen (ADVIL,MOTRIN) 600 MG tablet Take 1 tablet (600 mg total) by mouth every 6 (six) hours as needed.   methocarbamol (ROBAXIN) 750 MG tablet Take 1 tablet (750 mg total) by mouth 2 (two) times daily. (Patient not taking: Reported on 01/14/2022)   No facility-administered encounter medications on file as of 01/14/2022.    Follow-up: Return in about 4 weeks (around 02/11/2022) for CPE.   Renee Rival, FNP

## 2022-01-14 NOTE — Patient Instructions (Addendum)
Please consider getting your TDAP vaccine, shingles vaccine and influenza Vaccines.    You have been refered to dermatology as dicussed   Fasting labs 3-5 days before upcoming visit    It is important that you exercise regularly at least 30 minutes 5 times a week as tolerated  Think about what you will eat, plan ahead. Choose " clean, green, fresh or frozen" over canned, processed or packaged foods which are more sugary, salty and fatty. 70 to 75% of food eaten should be vegetables and fruit. Three meals at set times with snacks allowed between meals, but they must be fruit or vegetables. Aim to eat over a 12 hour period , example 7 am to 7 pm, and STOP after  your last meal of the day. Drink water,generally about 64 ounces per day, no other drink is as healthy. Fruit juice is best enjoyed in a healthy way, by EATING the fruit.  Thanks for choosing [facility] we consider it a privelige to serve you.

## 2022-01-14 NOTE — Assessment & Plan Note (Addendum)
Drinks 3-4  Beer daily.  Need to quit drinking including risk of  liver disease,intoxication,  falls from intoxication discussed patient verbalized understanding.

## 2022-01-14 NOTE — Assessment & Plan Note (Signed)
Uses  marijuana occasionally, risk of respiratory diseases  discussed with patient he verbalized understanding.

## 2022-01-27 ENCOUNTER — Telehealth: Payer: Self-pay

## 2022-01-27 NOTE — Telephone Encounter (Signed)
Patient calling to check on dermatology referral. Please call patient back with an update.

## 2022-02-11 ENCOUNTER — Encounter: Payer: Self-pay | Admitting: Gastroenterology

## 2022-02-12 DIAGNOSIS — L570 Actinic keratosis: Secondary | ICD-10-CM | POA: Diagnosis not present

## 2022-02-12 DIAGNOSIS — C44722 Squamous cell carcinoma of skin of right lower limb, including hip: Secondary | ICD-10-CM | POA: Diagnosis not present

## 2022-02-12 DIAGNOSIS — D485 Neoplasm of uncertain behavior of skin: Secondary | ICD-10-CM | POA: Diagnosis not present

## 2022-02-13 ENCOUNTER — Ambulatory Visit: Payer: BC Managed Care – PPO | Admitting: Nurse Practitioner

## 2022-02-13 ENCOUNTER — Encounter: Payer: Self-pay | Admitting: Nurse Practitioner

## 2022-02-13 VITALS — BP 134/85 | HR 61 | Temp 98.4°F | Ht 69.0 in | Wt 168.2 lb

## 2022-02-13 DIAGNOSIS — Z0000 Encounter for general adult medical examination without abnormal findings: Secondary | ICD-10-CM

## 2022-02-13 DIAGNOSIS — F1010 Alcohol abuse, uncomplicated: Secondary | ICD-10-CM

## 2022-02-13 NOTE — Assessment & Plan Note (Signed)
Annual exam as documented.  Counseling done include healthy lifestyle involving committing to 150 minutes of exercise per week, heart healthy diet, and attaining healthy weight. The importance of adequate sleep also discussed.  Regular use of seat belt and home safety were also discussed . Changes in health habits are decided on by patient with goals and time frames set for achieving them. Immunization and cancer screening  needs are specifically addressed at this visit.   Flu vaccine given in the office today patient encouraged to get Tdap and shingles vaccine at the pharmacy.

## 2022-02-13 NOTE — Assessment & Plan Note (Signed)
Again patient was counseled on alcohol cessation today.

## 2022-02-13 NOTE — Progress Notes (Addendum)
Complete physical exam  Patient: Johnny Cook   DOB: 1965-11-21   56 y.o. Male  MRN: [id number]  Subjective:    Chief Complaint  Patient presents with   Follow-up    For lesion leg right     Johnny Cook is a 56 y.o. male with past medical history of alcohol abuse, marijuana user  who presents for a complete physical examination.  He generally consumes a general diet.  Walks 10 hours daily at work.  He had a visit to the dermatologist yesterday, the lesion on his right shin was biopsied  he is awaiting the result.  Has upcoming consultation for colonoscopy with GI in December.  Due for Tdap vaccine, shingles vaccine, flu vaccine need for all vaccines discussed.  Flu Vaccine given in the office today.   Patient denies any new complaints today.  Patient denies chest pain, shortness of breath, wheezing, fever, constipation, diarrhea  Most recent fall risk assessment:    01/14/2022    1:16 PM  [place] in the past year? 0  Number falls in past yr: 0  Injury with Fall? 0     Most recent depression screenings:    02/13/2022    1:13 PM 01/14/2022    1:17 PM  PHQ 2/9 Scores  PHQ - 2 Score 0 0  PHQ- 9 Score 5 0        Patient Care Team: Renee Rival, FNP as PCP - General (Nurse Practitioner)   Outpatient Medications Prior to Visit  Medication Sig   doxycycline (VIBRAMYCIN) 100 MG capsule Take 1 capsule (100 mg total) by mouth 2 (two) times daily. (Patient not taking: Reported on 02/13/2022)   ibuprofen (ADVIL,MOTRIN) 600 MG tablet Take 1 tablet (600 mg total) by mouth every 6 (six) hours as needed. (Patient not taking: Reported on 02/13/2022)   methocarbamol (ROBAXIN) 750 MG tablet Take 1 tablet (750 mg total) by mouth 2 (two) times daily. (Patient not taking: Reported on 01/14/2022)   No facility-administered medications prior to visit.    Review of Systems  Constitutional: Negative.  Negative for chills, fever, malaise/fatigue and weight  loss.  HENT: Negative.    Eyes: Negative.   Respiratory: Negative.  Negative for cough, hemoptysis, sputum production, shortness of breath and wheezing.   Cardiovascular: Negative.  Negative for chest pain, palpitations, orthopnea and PND.  Gastrointestinal: Negative.   Genitourinary: Negative.   Musculoskeletal: Negative.   Skin: Negative.   Neurological: Negative.   Endo/Heme/Allergies: Negative.   Psychiatric/Behavioral: Negative.  Negative for hallucinations and suicidal ideas. The patient is not nervous/anxious and does not have insomnia.           Objective:     BP 134/85   Pulse 61   Temp 98.4 F (36.9 C)   Ht _0  (1.753 m)   Wt 168 lb 3.2 oz (76.3 kg)   SpO2 98%   BMI 24.84 kg/m    Physical Exam Constitutional:      General: He is not in acute distress.    Appearance: Normal appearance. He is not ill-appearing, toxic-appearing or diaphoretic.  HENT:     Head: Normocephalic and atraumatic.     Right Ear: Tympanic membrane, ear canal and external ear normal. There is no impacted cerumen.     Left Ear: Tympanic membrane, ear canal and external ear normal. There is no impacted cerumen.     Nose: Nose normal. No congestion or rhinorrhea.  Mouth/Throat:     Mouth: Mucous membranes are dry.     Pharynx: Oropharynx is clear. No oropharyngeal exudate or posterior oropharyngeal erythema.  Eyes:     General: No scleral icterus.       Right eye: No discharge.        Left eye: No discharge.     Extraocular Movements: Extraocular movements intact.     Conjunctiva/sclera: Conjunctivae normal.     Pupils: Pupils are equal, round, and reactive to light.  Neck:     Vascular: No carotid bruit.  Cardiovascular:     Rate and Rhythm: Normal rate and regular rhythm.     Pulses: Normal pulses.     Heart sounds: Normal heart sounds. No murmur heard.    No friction rub. No gallop.  Pulmonary:     Effort: Pulmonary effort is normal. No respiratory distress.     Breath  sounds: Normal breath sounds. No stridor. No wheezing, rhonchi or rales.  Chest:     Chest wall: No tenderness.  Abdominal:     General: There is no distension.     Palpations: Abdomen is soft. There is no mass.     Tenderness: There is no abdominal tenderness. There is no right CVA tenderness, left CVA tenderness, guarding or rebound.     Hernia: No hernia is present.  Musculoskeletal:        General: No swelling, tenderness, deformity or signs of injury.     Cervical back: Normal range of motion and neck supple. No rigidity or tenderness.     Right lower leg: No edema.     Left lower leg: No edema.  Lymphadenopathy:     Cervical: No cervical adenopathy.  Skin:    General: Skin is warm and dry.     Capillary Refill: Capillary refill takes less than 2 seconds.     Coloration: Skin is not jaundiced or pale.     Findings: No bruising, erythema or rash.     Comments: Excoriation noted on right forearm, ecchymosis noted on left forearm. Skin biopsy site on right shin Band-Aid.   Neurological:     Mental Status: He is alert and oriented to person, place, and time.     Cranial Nerves: No cranial nerve deficit.     Sensory: No sensory deficit.     Motor: No weakness.     Coordination: Coordination normal.     Gait: Gait normal.     Deep Tendon Reflexes: Reflexes normal.  Psychiatric:        Mood and Affect: Mood normal.        Behavior: Behavior normal.        Thought Content: Thought content normal.        Judgment: Judgment normal.      No results found for any visits on 02/13/22.     Assessment & Plan:    Routine Health Maintenance and Physical Exam  Immunization History  Administered Date(s) Administered   PFIZER(Purple Top)SARS-COV-2 Vaccination 07/06/2019, 07/27/2019    Health Maintenance  Topic Date Due   Hepatitis C Screening  Never done   COLONOSCOPY (Pts 45-105yr Insurance coverage will need to be confirmed)  Never done   COVID-19 Vaccine ([facility] - [facility]  03/01/2022 (Originally 09/21/2019)   Zoster Vaccines- Shingrix (1 of 2) 04/16/2022 (Originally 03/14/2018)   INFLUENZA VACCINE  07/12/2022 (Originally 11/11/2021)   TETANUS/TDAP  01/15/2023 (Originally 03/15/1987)   HIV Screening  02/14/2023 (Originally 03/15/1983)   HPV VACCINES  Aged Out    Discussed health benefits of physical activity, and encouraged him to engage in regular exercise appropriate for his age and condition.  Problem List Items Addressed This Visit       Other   Alcohol abuse, daily use    Again patient was counseled on alcohol cessation today.      Annual physical exam - Primary    Annual exam as documented.  Counseling done include healthy lifestyle involving committing to 150 minutes of exercise per week, heart healthy diet, and attaining healthy weight. The importance of adequate sleep also discussed.  Regular use of seat belt and home safety were also discussed . Changes in health habits are decided on by patient with goals and time frames set for achieving them. Immunization and cancer screening  needs are specifically addressed at this visit.   Flu vaccine given in the office today patient encouraged to get Tdap and shingles vaccine at the pharmacy.      Relevant Orders   Hepatitis C antibody   PSA   Hemoglobin A1c   CBC with Differential   CMP14+EGFR   TSH   Vitamin D, 25-hydroxy   HIV antibody (with reflex)   Lipid Panel   Return in about 1 year (around 02/14/2023) for CPE.     Renee Rival, FNP

## 2022-02-13 NOTE — Patient Instructions (Signed)
Flu vaccine today , please get your shingles vaccine and TDAP vaccines at the pharmacy    It is important that you exercise regularly at least 30 minutes 5 times a week as tolerated  Think about what you will eat, plan ahead. Choose " clean, green, fresh or frozen" over canned, processed or packaged foods which are more sugary, salty and fatty. 70 to 75% of food eaten should be vegetables and fruit. Three meals at set times with snacks allowed between meals, but they must be fruit or vegetables. Aim to eat over a 12 hour period , example 7 am to 7 pm, and STOP after  your last meal of the day. Drink water,generally about 64 ounces per day, no other drink is as healthy. Fruit juice is best enjoyed in a healthy way, by EATING the fruit.  Thanks for choosing [facility] we consider it a privelige to serve you.

## 2022-02-14 LAB — HEPATITIS C ANTIBODY: Hep C Virus Ab: NONREACTIVE

## 2022-02-14 LAB — HEMOGLOBIN A1C
Est. average glucose Bld gHb Est-mCnc: 117 mg/dL
Hgb A1c MFr Bld: 5.7 % — ABNORMAL HIGH (ref 4.8–5.6)

## 2022-02-14 LAB — PSA: Prostate Specific Ag, Serum: 3.2 ng/mL (ref 0.0–4.0)

## 2022-02-25 NOTE — Progress Notes (Signed)
Some labs are still pending, can we find out about the status of the labs, thanks

## 2022-02-27 ENCOUNTER — Encounter: Payer: Self-pay | Admitting: Nurse Practitioner

## 2022-02-27 DIAGNOSIS — R7303 Prediabetes: Secondary | ICD-10-CM

## 2022-02-27 HISTORY — DX: Prediabetes: R73.03

## 2022-02-27 LAB — CBC WITH DIFFERENTIAL/PLATELET
Basophils Absolute: 0.1 x10E3/uL (ref 0.0–0.2)
Basos: 1 %
EOS (ABSOLUTE): 0.1 x10E3/uL (ref 0.0–0.4)
Eos: 1 %
Hematocrit: 49.3 % (ref 37.5–51.0)
Hemoglobin: 16.4 g/dL (ref 13.0–17.7)
Immature Grans (Abs): 0.1 x10E3/uL (ref 0.0–0.1)
Immature Granulocytes: 1 %
Lymphocytes Absolute: 1.5 x10E3/uL (ref 0.7–3.1)
Lymphs: 17 %
MCH: 32.3 pg (ref 26.6–33.0)
MCHC: 33.3 g/dL (ref 31.5–35.7)
MCV: 97 fL (ref 79–97)
Monocytes Absolute: 0.5 x10E3/uL (ref 0.1–0.9)
Monocytes: 5 %
Neutrophils Absolute: 6.6 x10E3/uL (ref 1.4–7.0)
Neutrophils: 75 %
Platelets: 262 x10E3/uL (ref 150–450)
RBC: 5.07 x10E6/uL (ref 4.14–5.80)
RDW: 13.4 % (ref 11.6–15.4)
WBC: 8.7 x10E3/uL (ref 3.4–10.8)

## 2022-02-27 LAB — CMP14+EGFR

## 2022-02-27 LAB — LIPID PANEL

## 2022-02-27 LAB — TSH

## 2022-02-27 LAB — VITAMIN D 25 HYDROXY (VIT D DEFICIENCY, FRACTURES): Vit D, 25-Hydroxy: 20.5 ng/mL — ABNORMAL LOW (ref 30.0–100.0)

## 2022-02-27 LAB — SPECIMEN STATUS REPORT

## 2022-03-13 ENCOUNTER — Ambulatory Visit (AMBULATORY_SURGERY_CENTER): Payer: BC Managed Care – PPO | Admitting: *Deleted

## 2022-03-13 VITALS — Ht 69.0 in | Wt 172.0 lb

## 2022-03-13 DIAGNOSIS — Z1211 Encounter for screening for malignant neoplasm of colon: Secondary | ICD-10-CM

## 2022-03-13 MED ORDER — NA SULFATE-K SULFATE-MG SULF 17.5-3.13-1.6 GM/177ML PO SOLN: 1.0000 | Freq: Once | ORAL | 0 refills | Status: AC

## 2022-03-13 NOTE — Progress Notes (Signed)

## 2022-03-17 DIAGNOSIS — C44722 Squamous cell carcinoma of skin of right lower limb, including hip: Secondary | ICD-10-CM | POA: Diagnosis not present

## 2022-03-17 HISTORY — PX: OTHER SURGICAL HISTORY: SHX169

## 2022-03-27 ENCOUNTER — Ambulatory Visit: Payer: BC Managed Care – PPO | Admitting: Nurse Practitioner

## 2022-04-01 ENCOUNTER — Encounter: Payer: Self-pay | Admitting: Gastroenterology

## 2022-04-02 ENCOUNTER — Encounter: Payer: Self-pay | Admitting: Certified Registered Nurse Anesthetist

## 2022-04-03 ENCOUNTER — Ambulatory Visit (AMBULATORY_SURGERY_CENTER): Payer: BC Managed Care – PPO | Admitting: Gastroenterology

## 2022-04-03 ENCOUNTER — Encounter: Payer: Self-pay | Admitting: Gastroenterology

## 2022-04-03 VITALS — BP 113/78 | HR 64 | Temp 98.0°F | Resp 19 | Ht 69.0 in | Wt 172.0 lb

## 2022-04-03 DIAGNOSIS — Z1211 Encounter for screening for malignant neoplasm of colon: Secondary | ICD-10-CM

## 2022-04-03 DIAGNOSIS — D120 Benign neoplasm of cecum: Secondary | ICD-10-CM

## 2022-04-03 DIAGNOSIS — D124 Benign neoplasm of descending colon: Secondary | ICD-10-CM | POA: Diagnosis not present

## 2022-04-03 DIAGNOSIS — K635 Polyp of colon: Secondary | ICD-10-CM | POA: Diagnosis not present

## 2022-04-03 MED ORDER — SODIUM CHLORIDE 0.9 % IV SOLN: 500.0000 mL | Freq: Once | INTRAVENOUS | Status: DC

## 2022-04-03 NOTE — Patient Instructions (Signed)
YOU HAD AN ENDOSCOPIC PROCEDURE TODAY AT THE [facility]:   Refer to the procedure report that was given to you for any specific questions about what was found during the examination.  If the procedure report does not answer your questions, please call your gastroenterologist to clarify.  If you requested that your care partner not be given the details of your procedure findings, then the procedure report has been included in a sealed envelope for you to review at your convenience later.  **Handouts given on polyps and hemorrhoids**  YOU SHOULD EXPECT: Some feelings of bloating in the abdomen. Passage of more gas than usual.  Walking can help get rid of the air that was put into your GI tract during the procedure and reduce the bloating. If you had a lower endoscopy (such as a colonoscopy or flexible sigmoidoscopy) you may notice spotting of blood in your stool or on the toilet paper. If you underwent a bowel prep for your procedure, you may not have a normal bowel movement for a few days.  Please Note:  You might notice some irritation and congestion in your nose or some drainage.  This is from the oxygen used during your procedure.  There is no need for concern and it should clear up in a day or so.  SYMPTOMS TO REPORT IMMEDIATELY:  Following lower endoscopy (colonoscopy or flexible sigmoidoscopy):  Excessive amounts of blood in the stool  Significant tenderness or worsening of abdominal pains  Swelling of the abdomen that is new, acute  Fever of 100F or higher  For urgent or emergent issues, a gastroenterologist can be reached at any hour by calling [phone]. Do not use MyChart messaging for urgent concerns.    DIET:  We do recommend a small meal at first, but then you may proceed to your regular diet.  Drink plenty of fluids but you should avoid alcoholic beverages for 24 hours.  ACTIVITY:  You should plan to take it easy for the rest of today and you should NOT DRIVE or  use heavy machinery until tomorrow (because of the sedation medicines used during the test).    FOLLOW UP: Our staff will call the number listed on your records the next business day following your procedure.  We will call around 7:15- 8:00 am to check on you and address any questions or concerns that you may have regarding the information given to you following your procedure. If we do not reach you, we will leave a message.     If any biopsies were taken you will be contacted by phone or by letter within the next 1-3 weeks.  Please call us at [phone] if you have not heard about the biopsies in 3 weeks.    SIGNATURES/CONFIDENTIALITY: You and/or your care partner have signed paperwork which will be entered into your electronic medical record.  These signatures attest to the fact that that the information above on your After Visit Summary has been reviewed and is understood.  Full responsibility of the confidentiality of this discharge information lies with you and/or your care-partner. 

## 2022-04-03 NOTE — Progress Notes (Signed)
Pt's states no medical or surgical changes since previsit or office visit. 

## 2022-04-03 NOTE — Progress Notes (Signed)
Called to room to assist during endoscopic procedure.  Patient ID and intended procedure confirmed with present staff. Received instructions for my participation in the procedure from the performing physician.  

## 2022-04-03 NOTE — Op Note (Signed)
[place] Patient Name: Johnny Cook Procedure Date: 04/03/2022 9:58 AM MRN: [id number] Endoscopist: [place]. Loletha Cook , MD, [id number] Age: 56 Referring MD:  Date of Birth: 04/30/1965 Gender: Male Account #: [id number] Procedure:                Colonoscopy Indications:              Screening for colorectal malignant neoplasm, This                            is the patient's first colonoscopy Medicines:                Monitored Anesthesia Care Procedure:                Pre-Anesthesia Assessment:                           - Prior to the procedure, a History and Physical                            was performed, and patient medications and                            allergies were reviewed. The patient's tolerance of                            previous anesthesia was also reviewed. The risks                            and benefits of the procedure and the sedation                            options and risks were discussed with the patient.                            All questions were answered, and informed consent                            was obtained. Prior Anticoagulants: The patient has                            taken no anticoagulant or antiplatelet agents. ASA                            Grade Assessment: II - A patient with mild systemic                            disease. After reviewing the risks and benefits,                            the patient was deemed in satisfactory condition to                            undergo the procedure.  After obtaining informed consent, the colonoscope                            was passed under direct vision. Throughout the                            procedure, the patient's blood pressure, pulse, and                            oxygen saturations were monitored continuously. The                            Olympus CF-HQ190L (Serial# [id number]) Colonoscope was                            introduced through  the anus and advanced to the the                            cecum, identified by appendiceal orifice and                            ileocecal valve. The colonoscopy was performed                            without difficulty. The patient tolerated the                            procedure well. The quality of the bowel                            preparation was good. The ileocecal valve,                            appendiceal orifice, and rectum were photographed.                            The bowel preparation used was SUPREP via split                            dose instruction. Scope In: 10:07:55 AM Scope Out: 10:21:53 AM Scope Withdrawal Time: 0 hours 11 minutes 45 seconds  Total Procedure Duration: 0 hours 13 minutes 58 seconds  Findings:                 The perianal and digital rectal examinations were                            normal.                           Repeat examination of right colon under NBI                            performed.  Two flat and sessile polyps were found in the                            cecum. The polyps were 3 to 6 mm in size. These                            polyps were removed with a cold snare. Resection                            and retrieval were complete. (Jar 1)                           A 6 mm polyp was found in the descending colon. The                            polyp was semi-sessile. The polyp was removed with                            a cold snare. Resection and retrieval were                            complete. (Jar 2)                           Internal hemorrhoids were found. The hemorrhoids                            were Grade I (internal hemorrhoids that do not                            prolapse).                           The exam was otherwise without abnormality on                            direct and retroflexion views. Complications:            No immediate complications. Estimated Blood Loss:      Estimated blood loss was minimal. Impression:               - Two 3 to 6 mm polyps in the cecum, removed with a                            cold snare. Resected and retrieved.                           - One 6 mm polyp in the descending colon, removed                            with a cold snare. Resected and retrieved.                           - Internal hemorrhoids.                           -  The examination was otherwise normal on direct                            and retroflexion views. Recommendation:           - Patient has a contact number available for                            emergencies. The signs and symptoms of potential                            delayed complications were discussed with the                            patient. Return to normal activities tomorrow.                            Written discharge instructions were provided to the                            patient.                           - Resume previous diet.                           - Continue present medications.                           - Await pathology results.                           - Repeat colonoscopy is recommended for                            surveillance. The colonoscopy date will be                            determined after pathology results from today's                            exam become available for review. Johnny Weller L. Loletha Carrow, MD 04/03/2022 10:29:29 AM This report has been signed electronically.

## 2022-04-03 NOTE — Progress Notes (Signed)
Report given to PACU, vss 

## 2022-04-03 NOTE — Progress Notes (Signed)
History and Physical:  This patient presents for endoscopic testing for: Encounter Diagnosis  Name Primary?   Special screening for malignant neoplasms, colon Yes    Average risk - first screening exam Patient denies chronic abdominal pain, rectal bleeding, constipation or diarrhea.   Patient is otherwise without complaints or active issues today.   Past Medical History: Past Medical History:  Diagnosis Date   Allergy    SEASONAL   Cancer ([place])    SKIN   GERD (gastroesophageal reflux disease)      Past Surgical History: Past Surgical History:  Procedure Laterality Date   EYE SURGERY Bilateral    CATRACTS   skin cancer removal Right 03/17/2022   skin cancer removal RLL    Allergies: No Known Allergies  Outpatient Meds: Current Outpatient Medications  Medication Sig Dispense Refill   doxycycline (VIBRA-TABS) 100 MG tablet Take 100 mg by mouth 2 (two) times daily.     ibuprofen (ADVIL,MOTRIN) 600 MG tablet Take 1 tablet (600 mg total) by mouth every 6 (six) hours as needed. (Patient not taking: Reported on 02/13/2022) 30 tablet 0   methocarbamol (ROBAXIN) 750 MG tablet Take 1 tablet (750 mg total) by mouth 2 (two) times daily. (Patient not taking: Reported on 01/14/2022) 20 tablet 0   Current Facility-Administered Medications  Medication Dose Route Frequency Provider Last Rate Last Admin   0.9 %  sodium chloride infusion  500 mL Intravenous Once Danis, Estill Cotta III, MD          ___________________________________________________________________ Objective   Exam:  BP (!) 142/96   Pulse 75   Temp 98 F (36.7 C)   Ht '5\' 9"'$  (1.753 m)   Wt 172 lb (78 kg)   SpO2 97%   BMI 25.40 kg/m   CV: regular , S1/S2 Resp: clear to auscultation bilaterally, normal RR and effort noted GI: soft, no tenderness, with active bowel sounds.   Assessment: Encounter Diagnosis  Name Primary?   Special screening for malignant neoplasms, colon Yes     Plan: Colonoscopy  The  benefits and risks of the planned procedure were described in detail with the patient or (when appropriate) their health care proxy.  Risks were outlined as including, but not limited to, bleeding, infection, perforation, adverse medication reaction leading to cardiac or pulmonary decompensation, pancreatitis (if ERCP).  The limitation of incomplete mucosal visualization was also discussed.  No guarantees or warranties were given.    The patient is appropriate for an endoscopic procedure in the ambulatory setting.   - Wilfrid Lund, MD

## 2022-04-08 ENCOUNTER — Telehealth: Payer: Self-pay

## 2022-04-08 NOTE — Telephone Encounter (Signed)
Follow up call placed, VM obtained and message left. 

## 2022-04-15 ENCOUNTER — Encounter: Payer: Self-pay | Admitting: Gastroenterology

## 2022-05-07 DIAGNOSIS — Z48817 Encounter for surgical aftercare following surgery on the skin and subcutaneous tissue: Secondary | ICD-10-CM | POA: Diagnosis not present

## 2022-05-07 DIAGNOSIS — Z4801 Encounter for change or removal of surgical wound dressing: Secondary | ICD-10-CM | POA: Diagnosis not present

## 2022-06-26 DIAGNOSIS — Z961 Presence of intraocular lens: Secondary | ICD-10-CM | POA: Diagnosis not present

## 2022-06-26 DIAGNOSIS — H26492 Other secondary cataract, left eye: Secondary | ICD-10-CM | POA: Diagnosis not present

## 2022-06-26 DIAGNOSIS — H34211 Partial retinal artery occlusion, right eye: Secondary | ICD-10-CM | POA: Diagnosis not present

## 2022-07-01 ENCOUNTER — Telehealth: Payer: Self-pay

## 2022-07-06 ENCOUNTER — Encounter: Payer: Self-pay | Admitting: Nurse Practitioner

## 2022-07-06 ENCOUNTER — Ambulatory Visit (INDEPENDENT_AMBULATORY_CARE_PROVIDER_SITE_OTHER): Payer: BC Managed Care – PPO | Admitting: Nurse Practitioner

## 2022-07-06 ENCOUNTER — Other Ambulatory Visit: Payer: Self-pay | Admitting: Nurse Practitioner

## 2022-07-06 VITALS — BP 125/76 | HR 93 | Temp 98.2°F | Ht 69.0 in | Wt 172.6 lb

## 2022-07-06 DIAGNOSIS — Z13228 Encounter for screening for other metabolic disorders: Secondary | ICD-10-CM

## 2022-07-06 DIAGNOSIS — F1010 Alcohol abuse, uncomplicated: Secondary | ICD-10-CM

## 2022-07-06 DIAGNOSIS — Z130 Encounter for screening for diseases of the blood and blood-forming organs and certain disorders involving the immune mechanism: Secondary | ICD-10-CM

## 2022-07-06 DIAGNOSIS — Z136 Encounter for screening for cardiovascular disorders: Secondary | ICD-10-CM

## 2022-07-06 DIAGNOSIS — Z1329 Encounter for screening for other suspected endocrine disorder: Secondary | ICD-10-CM | POA: Diagnosis not present

## 2022-07-06 DIAGNOSIS — Z23 Encounter for immunization: Secondary | ICD-10-CM | POA: Insufficient documentation

## 2022-07-06 DIAGNOSIS — E559 Vitamin D deficiency, unspecified: Secondary | ICD-10-CM | POA: Insufficient documentation

## 2022-07-06 DIAGNOSIS — Z1321 Encounter for screening for nutritional disorder: Secondary | ICD-10-CM

## 2022-07-06 NOTE — Patient Instructions (Signed)
Please start taking vitamin D 1000 units daily, get the medication over the counter   Foods rich in vitamin D include Cod liver oil,Salmon,Swordfish,Tuna fish,Orange juice fortified with vitamin D,Dairy and plant milks fortified with vitamin D,Sardines,Beef liver    It is important that you exercise regularly at least 30 minutes 5 times a week as tolerated  Think about what you will eat, plan ahead. Choose " clean, green, fresh or frozen" over canned, processed or packaged foods which are more sugary, salty and fatty. 70 to 75% of food eaten should be vegetables and fruit. Three meals at set times with snacks allowed between meals, but they must be fruit or vegetables. Aim to eat over a 12 hour period , example 7 am to 7 pm, and STOP after  your last meal of the day. Drink water,generally about 64 ounces per day, no other drink is as healthy. Fruit juice is best enjoyed in a healthy way, by EATING the fruit.  Thanks for choosing [facility] we consider it a privelige to serve you.

## 2022-07-06 NOTE — Progress Notes (Signed)
Acute Office Visit  Subjective:     Patient ID: Johnny Cook, male    DOB: January 31, 1966, 57 y.o.   MRN: [id number]  Chief Complaint  Patient presents with   Follow-up    HPI Johnny Cook  has a past medical history of Allergy, Cancer ([place]), and GERD (gastroesophageal reflux disease).,  Alcohol abuse, vitamin D deficiency presents with complaints of concerns with buildup of cholesterol in his eyes.  Patient stated that Johnny Cook recently went for a follow-up visit with the ophthalmologist after having cataract removal surgery.  Johnny Cook stated that the ophthalmologist was worried about buildup of cholesterol in his right eye.  Johnny Cook currently denies headache, dizziness, numbness,  tingling, weakness never had a stroke in the past.  Johnny Cook drinks 3 beers daily.  Need to quit drinking alcohol with discussed with the patient today.  Fasting lipid panel ordered.  Records were requested from the ophthalmologist. Carotid ultrasound and echocardiogram requested by opthamologist, tests were ordered toady    Review of Systems  Respiratory: Negative.    Cardiovascular: Negative.   Genitourinary: Negative.   Musculoskeletal: Negative.   Neurological: Negative.   Psychiatric/Behavioral: Negative.         Objective:    BP 125/76   Pulse 93   Temp 98.2 F (36.8 C)   Ht 5\' 9"  (1.753 m)   Wt 172 lb 9.6 oz (78.3 kg)   SpO2 95%   BMI 25.49 kg/m    Physical Exam Constitutional:      General: Johnny Cook is not in acute distress.    Appearance: Johnny Cook is not ill-appearing, toxic-appearing or diaphoretic.  Eyes:     General: No scleral icterus.       Right eye: No discharge.        Left eye: No discharge.     Extraocular Movements: Extraocular movements intact.     Conjunctiva/sclera: Conjunctivae normal.  Cardiovascular:     Rate and Rhythm: Normal rate and regular rhythm.     Pulses: Normal pulses.     Heart sounds: Normal heart sounds. No murmur heard.    No friction rub. No gallop.  Pulmonary:     Effort:  Pulmonary effort is normal. No respiratory distress.     Breath sounds: Normal breath sounds. No stridor. No wheezing, rhonchi or rales.  Chest:     Chest wall: No tenderness.  Abdominal:     General: There is no distension.     Palpations: Abdomen is soft.     Tenderness: There is no abdominal tenderness. There is no right CVA tenderness or guarding.  Musculoskeletal:        General: No swelling, tenderness, deformity or signs of injury.     Right lower leg: No edema.     Left lower leg: No edema.  Skin:    General: Skin is warm and dry.  Neurological:     Mental Status: Johnny Cook is alert and oriented to person, place, and time.     Cranial Nerves: No cranial nerve deficit.     Motor: No weakness.     Coordination: Coordination normal.     Gait: Gait normal.  Psychiatric:        Mood and Affect: Mood normal.        Behavior: Behavior normal.        Thought Content: Thought content normal.        Judgment: Judgment normal.     No results found for any visits on 07/06/22.  Assessment & Plan:   Problem List Items Addressed This Visit       Other   Alcohol abuse, daily use    Drinks about 3 beers a daily Patient counseled on alcohol cessation       Need for Tdap vaccination    Patient educated on CDC recommendation for the TDAP vaccine. Verbal consent was obtained from the patient, vaccine administered by nurse, no sign of adverse reactions noted at this time. Patient education on arm soreness and use of tylenol  for this patient  was discussed. Patient educated on the signs and symptoms of adverse effect and advise to contact the office if they occur.       Relevant Orders   Tdap vaccine greater than or equal to 7yo IM (Completed)   Need for influenza vaccination    Patient educated on CDC recommendation for the influenza  vaccine. Verbal consent was obtained from the patient, vaccine administered by nurse, no sign of adverse reactions noted at this time. Patient  education on arm soreness and use of tylenol ofor this patient  was discussed. Patient educated on the signs and symptoms of adverse effect and advise to contact the office if they occur.       Relevant Orders   Flu Vaccine QUAD 53mo+IM (Fluarix, Fluzone & Alfiuria Quad PF) (Completed)   Vitamin D deficiency    Last vitamin D Lab Results  Component Value Date   VD25OH 20.5 (L) 02/13/2022  Currently not on medication Patient was encouraged to take OTC vitamin D 1000 units daily      Encounter for screening for stenosis of carotid artery    As requested by opthamologist  Carotid US and echocardiogram ordered.  Checking fasting lipid panel      Other Visit Diagnoses     Screening for endocrine, nutritional, metabolic and immunity disorder    -  Primary   Relevant Orders   Lipid Panel   CMP14+EGFR   TSH   HIV antibody (with reflex)       No orders of the defined types were placed in this encounter.   Return in about 8 months (around 03/08/2023) for CPE.  Renee Rival, FNP

## 2022-07-06 NOTE — Assessment & Plan Note (Signed)
Patient educated on CDC recommendation for the TDAP vaccine. Verbal consent was obtained from the patient, vaccine administered by nurse, no sign of adverse reactions noted at this time. Patient education on arm soreness and use of tylenol  for this patient  was discussed. Patient educated on the signs and symptoms of adverse effect and advise to contact the office if they occur. 

## 2022-07-06 NOTE — Assessment & Plan Note (Signed)
Drinks about 3 beers a daily Patient counseled on alcohol cessation

## 2022-07-06 NOTE — Assessment & Plan Note (Signed)
Patient educated on CDC recommendation for the influenza vaccine. Verbal consent was obtained from the patient, vaccine administered by nurse, no sign of adverse reactions noted at this time. Patient education on arm soreness and use of tylenol ofor this patient  was discussed. Patient educated on the signs and symptoms of adverse effect and advise to contact the office if they occur.  

## 2022-07-06 NOTE — Assessment & Plan Note (Signed)
Last vitamin D Lab Results  Component Value Date   VD25OH 20.5 (L) 02/13/2022  Currently not on medication Patient was encouraged to take OTC vitamin D 1000 units daily

## 2022-07-07 DIAGNOSIS — Z136 Encounter for screening for cardiovascular disorders: Secondary | ICD-10-CM | POA: Insufficient documentation

## 2022-07-07 NOTE — Telephone Encounter (Signed)
Called Pt and inform message pt to call back with any questions or concerns. Gully

## 2022-07-07 NOTE — Assessment & Plan Note (Addendum)
As requested by opthamologist  Carotid US and echocardiogram ordered.  Checking fasting lipid panel

## 2022-07-10 ENCOUNTER — Other Ambulatory Visit: Payer: BC Managed Care – PPO

## 2022-07-10 DIAGNOSIS — Z1321 Encounter for screening for nutritional disorder: Secondary | ICD-10-CM | POA: Diagnosis not present

## 2022-07-10 DIAGNOSIS — Z130 Encounter for screening for diseases of the blood and blood-forming organs and certain disorders involving the immune mechanism: Secondary | ICD-10-CM | POA: Diagnosis not present

## 2022-07-10 DIAGNOSIS — Z1329 Encounter for screening for other suspected endocrine disorder: Secondary | ICD-10-CM

## 2022-07-10 DIAGNOSIS — Z13228 Encounter for screening for other metabolic disorders: Secondary | ICD-10-CM | POA: Diagnosis not present

## 2022-07-11 LAB — CMP14+EGFR
ALT: 41 IU/L (ref 0–44)
AST: 39 IU/L (ref 0–40)
Albumin/Globulin Ratio: 1.4 (ref 1.2–2.2)
Albumin: 3.7 g/dL — ABNORMAL LOW (ref 3.8–4.9)
Alkaline Phosphatase: 66 IU/L (ref 44–121)
BUN/Creatinine Ratio: 6 — ABNORMAL LOW (ref 9–20)
BUN: 6 mg/dL (ref 6–24)
Bilirubin Total: 0.5 mg/dL (ref 0.0–1.2)
CO2: 20 mmol/L (ref 20–29)
Calcium: 9.7 mg/dL (ref 8.7–10.2)
Chloride: 105 mmol/L (ref 96–106)
Creatinine, Ser: 1.07 mg/dL (ref 0.76–1.27)
Globulin, Total: 2.7 g/dL (ref 1.5–4.5)
Glucose: 134 mg/dL — ABNORMAL HIGH (ref 70–99)
Potassium: 3.7 mmol/L (ref 3.5–5.2)
Sodium: 143 mmol/L (ref 134–144)
Total Protein: 6.4 g/dL (ref 6.0–8.5)
eGFR: 82 mL/min/{1.73_m2} (ref >59)

## 2022-07-11 LAB — LIPID PANEL
Chol/HDL Ratio: 3 ratio (ref 0.0–5.0)
Cholesterol, Total: 189 mg/dL (ref 100–199)
HDL: 62 mg/dL (ref >39)
LDL Chol Calc (NIH): 107 mg/dL — ABNORMAL HIGH (ref 0–99)
Triglycerides: 115 mg/dL (ref 0–149)
VLDL Cholesterol Cal: 20 mg/dL (ref 5–40)

## 2022-07-11 LAB — HIV ANTIBODY (ROUTINE TESTING W REFLEX): HIV Screen 4th Generation wRfx: NONREACTIVE

## 2022-07-11 LAB — TSH: TSH: 1.2 u[IU]/mL (ref 0.450–4.500)

## 2022-07-15 ENCOUNTER — Other Ambulatory Visit: Payer: Self-pay | Admitting: Nurse Practitioner

## 2022-07-15 DIAGNOSIS — R7303 Prediabetes: Secondary | ICD-10-CM

## 2022-07-15 NOTE — Telephone Encounter (Signed)
PA was done and pt is schedule for 07/17/22. Gamaliel

## 2022-07-16 ENCOUNTER — Ambulatory Visit: Payer: BC Managed Care – PPO | Admitting: Dermatology

## 2022-07-17 ENCOUNTER — Other Ambulatory Visit: Payer: Self-pay | Admitting: Nurse Practitioner

## 2022-07-17 ENCOUNTER — Ambulatory Visit (HOSPITAL_COMMUNITY)
Admission: RE | Admit: 2022-07-17 | Discharge: 2022-07-17 | Disposition: A | Discharge status: Home / Self Care | Payer: BC Managed Care – PPO | Source: Ambulatory Visit | Attending: Nurse Practitioner | Admitting: Nurse Practitioner

## 2022-07-17 DIAGNOSIS — Z136 Encounter for screening for cardiovascular disorders: Secondary | ICD-10-CM | POA: Insufficient documentation

## 2022-07-17 DIAGNOSIS — I6523 Occlusion and stenosis of bilateral carotid arteries: Secondary | ICD-10-CM

## 2022-07-17 MED ORDER — ROSUVASTATIN CALCIUM 5 MG PO TABS: 5.0000 mg | ORAL_TABLET | Freq: Every day | ORAL | 0 refills | Status: DC

## 2022-07-31 ENCOUNTER — Ambulatory Visit (HOSPITAL_COMMUNITY)
Admission: RE | Admit: 2022-07-31 | Discharge: 2022-07-31 | Disposition: A | Discharge status: Home / Self Care | Payer: BC Managed Care – PPO | Source: Ambulatory Visit | Attending: Nurse Practitioner | Admitting: Nurse Practitioner

## 2022-07-31 DIAGNOSIS — I071 Rheumatic tricuspid insufficiency: Secondary | ICD-10-CM | POA: Diagnosis not present

## 2022-07-31 DIAGNOSIS — I361 Nonrheumatic tricuspid (valve) insufficiency: Secondary | ICD-10-CM | POA: Diagnosis not present

## 2022-07-31 DIAGNOSIS — Z136 Encounter for screening for cardiovascular disorders: Secondary | ICD-10-CM

## 2022-07-31 DIAGNOSIS — I6529 Occlusion and stenosis of unspecified carotid artery: Secondary | ICD-10-CM | POA: Diagnosis not present

## 2022-07-31 LAB — ECHOCARDIOGRAM COMPLETE
AR max vel: 1.97 cm2
AV Area VTI: 1.84 cm2
AV Area mean vel: 1.82 cm2
AV Mean grad: 3 mmHg
AV Peak grad: 5.9 mmHg
Ao pk vel: 1.21 m/s
Area-P 1/2: 3.2 cm2
Calc EF: 64.8 %
S' Lateral: 2.5 cm
Single Plane A2C EF: 65.3 %
Single Plane A4C EF: 66.4 %

## 2022-08-10 NOTE — Progress Notes (Unsigned)
[facility] [place]  ASSESSMENT / PLAN: Johnny Cook is a 57 y.o. male with asymptomatic bilateral 1-39% carotid artery stenosis.   The patient should continue best medical therapy for carotid artery stenosis including: Complete cessation from all tobacco products. Blood glucose control with goal A1c < 7%. Blood pressure control with goal blood pressure < 140/90 mmHg. Lipid reduction therapy with goal LDL-C <100 mg/dL (<54 if symptomatic from carotid artery stenosis).  Aspirin 81mg  PO QD.  Atorvastatin 40-80mg  PO QD (or other "high intensity" statin therapy).  Acute onset of swelling in the right hand beginning early this morning.  We obtained a venous duplex showing no evidence of upper extremity deep venous thrombosis.  Encouraged him to follow-up with his primary care physician to further evaluate this.  CHIEF COMPLAINT: Carotid artery stenosis on recent scan  HISTORY OF PRESENT ILLNESS: Johnny Cook is a 57 y.o. male for to clinic for evaluation of carotid artery stenosis demonstrated on duplex ordered by his primary care physician.  Patient is asymptomatic from a carotid artery standpoint.  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  I reviewed the carotid duplex findings today.  I reviewed the benign nature of these findings.  I reviewed the rationale for ongoing surveillance.  I reviewed the importance of best medical therapy to reduce his risk of heart attack or stroke.  Total to his reason for visit is acute development of right arm swelling and pain beginning early this morning.  He has never had these symptoms before.  He reports no inciting injury or trauma.  He is an active person and works in [employer], operational.   Past Medical History:  Diagnosis Date   Allergy    SEASONAL   Cancer ([facility])    SKIN   GERD (gastroesophageal reflux disease)     Past Surgical History:  Procedure  Laterality Date   EYE SURGERY Bilateral    CATRACTS   skin cancer removal Right 03/17/2022   skin cancer removal RLL    Family History  Problem Relation Age of Onset   Skin cancer Mother    Brain cancer Father    Colon cancer Neg Hx    Colon polyps Neg Hx    Crohn's disease Neg Hx    Esophageal cancer Neg Hx    Rectal cancer Neg Hx    Stomach cancer Neg Hx    Ulcerative colitis Neg Hx     Social History   Socioeconomic History   Marital status: Single    Spouse name: Not on file   Number of children: 1   Years of education: Not on file   Highest education level: Not on file  Occupational History   Not on file  Tobacco Use   Smoking status: Never    Passive exposure: Current   Smokeless tobacco: Never  Vaping Use   Vaping Use: Never used  Substance and Sexual Activity   Alcohol use: Yes    Comment: 3-4 beers per day   Drug use: Yes    Types: Marijuana    Comment: occasionally, last used about 3 days ago   Sexual activity: Not on file  Other Topics Concern   Not on file  Social History Narrative   Live with his room mate   Social Determinants of Health   Financial Resource Strain: Low Risk  (02/13/2022)   Overall Financial Resource Strain (CARDIA)    Difficulty of Paying  Living Expenses: Not hard at all  Food Insecurity: No Food Insecurity (02/13/2022)   Hunger Vital Sign    Worried About Running Out of Food in the Last Year: Never true    Ran Out of Food in the Last Year: Never true  Transportation Needs: No Transportation Needs (02/13/2022)   PRAPARE - Administrator, Civil Service (Medical): No    Lack of Transportation (Non-Medical): No  Physical Activity: Sufficiently Active (02/13/2022)   Exercise Vital Sign    Days of Exercise per Week: 4 days    Minutes of Exercise per Session: 150+ min  Stress: No Stress Concern Present (02/13/2022)   [employer] of Occupational Health - Occupational Stress Questionnaire    Feeling of Stress : Not  at all  Social Connections: Moderately Integrated (02/13/2022)   Social Connection and Isolation Panel [NHANES]    Frequency of Communication with Friends and Family: More than three times a week    Frequency of Social Gatherings with Friends and Family: Three times a week    Attends Religious Services: 1 to 4 times per year    Active Member of Clubs or Organizations: Yes    Attends Banker Meetings: Never    Marital Status: Never married  Intimate Partner Violence: Not At Risk (02/13/2022)   Humiliation, Afraid, Rape, and Kick questionnaire    Fear of Current or Ex-Partner: No    Emotionally Abused: No    Physically Abused: No    Sexually Abused: No    No Known Allergies  Current Outpatient Medications  Medication Sig Dispense Refill   rosuvastatin (CRESTOR) 5 MG tablet Take 1 tablet (5 mg total) by mouth daily. 90 tablet 0   No current facility-administered medications for this visit.    PHYSICAL EXAM Vitals:   08/11/22 1431 08/11/22 1435  BP: 101/67 99/69  Pulse: 65   Resp: 20   Temp: 98.3 F (36.8 C)   Weight: 170 lb (77.1 kg)   Height: 5\' 9"  (1.753 m)     Middle-age man in no distress Regular rate and rhythm Unlabored breathing 2+ radial pulses bilaterally Right hand 1-2+ edema compared to the left.  Tenderness throughout the left upper extremity and base of neck Paraspinal spasm in the neck on the right.  PERTINENT LABORATORY AND RADIOLOGIC DATA  Most recent CBC    Latest Ref Rng & Units 02/13/2022    1:48 PM 04/26/2018   11:45 AM  CBC  WBC 3.4 - 10.8 x10E3/uL 8.7  8.0   Hemoglobin 13.0 - 17.7 g/dL 16.1  09.6   Hematocrit 37.5 - 51.0 % 49.3  47.2   Platelets 150 - 450 x10E3/uL 262  246      Most recent CMP    Latest Ref Rng & Units 07/10/2022   10:54 AM 02/13/2022    1:48 PM 04/26/2018   11:45 AM  CMP  Glucose 70 - 99 mg/dL 045  CANCELED  86   BUN 6 - 24 mg/dL 6  CANCELED  9   Creatinine 0.76 - 1.27 mg/dL 4.09  CANCELED  8.11   Sodium  134 - 144 mmol/L 143  CANCELED  137   Potassium 3.5 - 5.2 mmol/L 3.7  CANCELED  3.7   Chloride 96 - 106 mmol/L 105  CANCELED  105   CO2 20 - 29 mmol/L 20  CANCELED  23   Calcium 8.7 - 10.2 mg/dL 9.7  CANCELED  9.1   Total Protein 6.0 - 8.5  g/dL 6.4  CANCELED    Total Bilirubin 0.0 - 1.2 mg/dL 0.5  CANCELED    Alkaline Phos 44 - 121 IU/L 66  CANCELED    AST 0 - 40 IU/L 39  CANCELED    ALT 0 - 44 IU/L 41  CANCELED      Renal function CrCl cannot be calculated (Patient's most recent lab result is older than the maximum 21 days allowed.).  Hgb A1c MFr Bld (%)  Date Value  02/13/2022 5.7 (H)    LDL Chol Calc (NIH)  Date Value Ref Range Status  07/10/2022 107 (H) 0 - 99 mg/dL Final     Vascular Imaging: Right Carotid: Velocities in the right ICA are consistent with a 1-39%  stenosis.   Left Carotid: Velocities in the left ICA are consistent with a 1-39%  stenosis.   Vertebrals: Bilateral vertebral arteries demonstrate antegrade flow.  Subclavians: Normal flow hemodynamics were seen in bilateral subclavian               arteries.   Rande Brunt. Lenell Antu, MD FACS [facility] Phone Number: [phone] 08/10/2022 10:20 AM   Total time spent on preparing this encounter including chart review, data review, collecting history, examining the patient, coordinating care for this new patient, 60 minutes.  Portions of this report may have been transcribed using voice recognition software.  Every effort has been made to ensure accuracy; however, inadvertent computerized transcription errors may still be present.

## 2022-08-11 ENCOUNTER — Ambulatory Visit: Payer: BC Managed Care – PPO | Admitting: Vascular Surgery

## 2022-08-11 ENCOUNTER — Ambulatory Visit (HOSPITAL_COMMUNITY)
Admission: RE | Admit: 2022-08-11 | Discharge: 2022-08-11 | Disposition: A | Discharge status: Home / Self Care | Payer: BC Managed Care – PPO | Source: Ambulatory Visit | Attending: Vascular Surgery | Admitting: Vascular Surgery

## 2022-08-11 ENCOUNTER — Encounter: Payer: Self-pay | Admitting: Vascular Surgery

## 2022-08-11 ENCOUNTER — Other Ambulatory Visit (HOSPITAL_COMMUNITY): Payer: Self-pay | Admitting: Vascular Surgery

## 2022-08-11 VITALS — BP 99/69 | HR 65 | Temp 98.3°F | Resp 20 | Ht 69.0 in | Wt 170.0 lb

## 2022-08-11 DIAGNOSIS — R609 Edema, unspecified: Secondary | ICD-10-CM

## 2022-08-11 DIAGNOSIS — I6523 Occlusion and stenosis of bilateral carotid arteries: Secondary | ICD-10-CM | POA: Diagnosis not present

## 2022-08-12 ENCOUNTER — Encounter: Payer: Self-pay | Admitting: Nurse Practitioner

## 2022-08-12 ENCOUNTER — Ambulatory Visit: Payer: BC Managed Care – PPO | Admitting: Nurse Practitioner

## 2022-08-12 VITALS — BP 127/78 | HR 85 | Temp 98.1°F | Ht 69.0 in | Wt 169.4 lb

## 2022-08-12 DIAGNOSIS — F1010 Alcohol abuse, uncomplicated: Secondary | ICD-10-CM

## 2022-08-12 DIAGNOSIS — M25531 Pain in right wrist: Secondary | ICD-10-CM

## 2022-08-12 MED ORDER — IBUPROFEN 800 MG PO TABS
800.0000 mg | ORAL_TABLET | Freq: Three times a day (TID) | ORAL | 0 refills | Status: AC | PRN
Start: 2022-08-12 — End: ?

## 2022-08-12 MED ORDER — KETOROLAC TROMETHAMINE 30 MG/ML IJ SOLN
30.0000 mg | Freq: Once | INTRAMUSCULAR | Status: AC
Start: 2022-08-12 — End: 2022-08-12
  Administered 2022-08-12: 30 mg via INTRAMUSCULAR

## 2022-08-12 MED ORDER — METHYLPREDNISOLONE SODIUM SUCC 40 MG IJ SOLR
40.0000 mg | Freq: Once | INTRAMUSCULAR | Status: AC
Start: 2022-08-12 — End: 2022-08-12
  Administered 2022-08-12: 40 mg via INTRAMUSCULAR

## 2022-08-12 NOTE — Progress Notes (Signed)
Acute Office Visit  Subjective:     Patient ID: Johnny Cook, male    DOB: [birth year], 57 y.o.   MRN: [id number]  Chief Complaint  Patient presents with   Hand Injury    Yesterday Pt woke up in the middle of the nigh with wrist pain.    Hand Injury  Pertinent negatives include no chest pain or numbness.   Mr. Leibold with past medical history: Alcohol abuse, prediabetes, vitamin D deficiency, hyperlipidemia presents with complaints of right wrist pain and swelling that started suddenly yesterday.  Patient stated that he woke up in the middle of the night with the pain and swelling. States that swellings has improved today. Currently Has aching pain rated 9/10. Taking OTC tylenol but has not helped much.  He denies fever, chills, malaise, numbness, tingling, trauma.  Stated that he never had this type of pain before.  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  Drinks 4, 12 ounces of beers daily.  Denies previous history of gout.     Review of Systems  Constitutional:  Negative for activity change, appetite change, chills, diaphoresis, fatigue, fever and unexpected weight change.  HENT:  Negative for congestion, dental problem, drooling and ear discharge.   Eyes:  Negative for pain, discharge, redness and itching.  Respiratory:  Negative for apnea, cough, choking, chest tightness, shortness of breath and wheezing.   Cardiovascular: Negative.  Negative for chest pain, palpitations and leg swelling.  Gastrointestinal:  Negative for abdominal distention, abdominal pain, anal bleeding, blood in stool, constipation, diarrhea and vomiting.  Endocrine: Negative for polydipsia, polyphagia and polyuria.  Genitourinary:  Negative for difficulty urinating, flank pain, frequency and genital sores.  Musculoskeletal: Negative.  Negative for back pain, gait problem and joint swelling.  Skin:  Negative for color change, pallor and rash.  Neurological:  Negative for dizziness, facial asymmetry,  light-headedness, numbness and headaches.  Psychiatric/Behavioral:  Negative for agitation, behavioral problems, confusion, hallucinations, self-injury, sleep disturbance and suicidal ideas.         Objective:    BP 127/78   Pulse 85   Temp 98.1 F (36.7 C)   Ht 5\' 9"  (1.753 m)   Wt 169 lb 6.4 oz (76.8 kg)   SpO2 97%   BMI 25.02 kg/m    Physical Exam Vitals and nursing note reviewed.  Constitutional:      General: He is not in acute distress.    Appearance: Normal appearance. He is obese. He is not ill-appearing, toxic-appearing or diaphoretic.     Comments: Facial skin appears flushed   HENT:     Mouth/Throat:     Mouth: Mucous membranes are moist.     Pharynx: Oropharynx is clear. No oropharyngeal exudate or posterior oropharyngeal erythema.  Eyes:     General: No scleral icterus.       Right eye: No discharge.        Left eye: No discharge.     Extraocular Movements: Extraocular movements intact.     Conjunctiva/sclera: Conjunctivae normal.  Cardiovascular:     Rate and Rhythm: Normal rate and regular rhythm.     Pulses: Normal pulses.     Heart sounds: Normal heart sounds. No murmur heard.    No friction rub. No gallop.  Pulmonary:     Effort: Pulmonary effort is normal. No respiratory distress.     Breath sounds: Normal breath sounds. No stridor. No wheezing, rhonchi or rales.  Chest:     Chest  wall: No tenderness.  Abdominal:     General: There is no distension.     Palpations: Abdomen is soft.     Tenderness: There is no abdominal tenderness. There is no right CVA tenderness, left CVA tenderness or guarding.  Musculoskeletal:        General: Swelling and tenderness present. No deformity or signs of injury.     Right lower leg: No edema.     Left lower leg: No edema.     Comments: Right wrist swelling on the anterior aspect  and tender on palpation, skin warm and dry.  Has palpable radial pulses  Skin:    General: Skin is warm and dry.     Capillary  Refill: Capillary refill takes less than 2 seconds.     Coloration: Skin is not jaundiced or pale.     Findings: No bruising, erythema or lesion.  Neurological:     Mental Status: He is alert and oriented to person, place, and time.     Motor: No weakness.     Coordination: Coordination normal.     Gait: Gait normal.  Psychiatric:        Mood and Affect: Mood normal.        Behavior: Behavior normal.        Thought Content: Thought content normal.        Judgment: Judgment normal.     No results found for any visits on 08/12/22.      Assessment & Plan:   Problem List Items Addressed This Visit       Other   Alcohol abuse, daily use    Drinks 4 bottles of beer daily Need to avoid excessive alcohol discussed with the patient      Acute pain of right wrist - Primary    1. Acute pain of right wrist  - ibuprofen (ADVIL) 800 MG tablet; Take 1 tablet (800 mg total) by mouth every 8 (eight) hours as needed.  Dispense: 30 tablet; Refill: 0 encouraged to take meds with meals.  One-time dose of Toradol 30 mg IM injection, Solu-Medrol 40 mg IM injection given. Application of heat or ice encouraged, rest of affected wrist Work note provided today.        Relevant Medications   ibuprofen (ADVIL) 800 MG tablet    Meds ordered this encounter  Medications   ibuprofen (ADVIL) 800 MG tablet    Sig: Take 1 tablet (800 mg total) by mouth every 8 (eight) hours as needed.    Dispense:  30 tablet    Refill:  0    No follow-ups on file.  Donell Beers, FNP

## 2022-08-12 NOTE — Patient Instructions (Signed)
1. Acute pain of right wrist  - ibuprofen (ADVIL) 800 MG tablet; Take 1 tablet (800 mg total) by mouth every 8 (eight) hours as needed.  Dispense: 30 tablet; Refill: 0     It is important that you exercise regularly at least 30 minutes 5 times a week as tolerated  Think about what you will eat, plan ahead. Choose " clean, green, fresh or frozen" over canned, processed or packaged foods which are more sugary, salty and fatty. 70 to 75% of food eaten should be vegetables and fruit. Three meals at set times with snacks allowed between meals, but they must be fruit or vegetables. Aim to eat over a 12 hour period , example 7 am to 7 pm, and STOP after  your last meal of the day. Drink water,generally about 64 ounces per day, no other drink is as healthy. Fruit juice is best enjoyed in a healthy way, by EATING the fruit.  Thanks for choosing [facility] we consider it a privelige to serve you.

## 2022-08-12 NOTE — Addendum Note (Signed)
Addended byRaj Janus on: 08/12/2022 03:28 PM   Modules accepted: Orders

## 2022-08-12 NOTE — Addendum Note (Signed)
Addended byRaj Janus on: 08/12/2022 04:01 PM   Modules accepted: Orders

## 2022-08-12 NOTE — Assessment & Plan Note (Signed)
Drinks 4 bottles of beer daily Need to avoid excessive alcohol discussed with the patient

## 2022-08-12 NOTE — Assessment & Plan Note (Signed)
1. Acute pain of right wrist  - ibuprofen (ADVIL) 800 MG tablet; Take 1 tablet (800 mg total) by mouth every 8 (eight) hours as needed.  Dispense: 30 tablet; Refill: 0 encouraged to take meds with meals.  One-time dose of Toradol 30 mg IM injection, Solu-Medrol 40 mg IM injection given. Application of heat or ice encouraged, rest of affected wrist Work note provided today.

## 2022-08-21 ENCOUNTER — Other Ambulatory Visit: Payer: Self-pay

## 2022-08-21 DIAGNOSIS — I6523 Occlusion and stenosis of bilateral carotid arteries: Secondary | ICD-10-CM

## 2022-09-10 DIAGNOSIS — C44219 Basal cell carcinoma of skin of left ear and external auricular canal: Secondary | ICD-10-CM | POA: Diagnosis not present

## 2022-09-10 DIAGNOSIS — D485 Neoplasm of uncertain behavior of skin: Secondary | ICD-10-CM | POA: Diagnosis not present

## 2022-09-10 DIAGNOSIS — C44329 Squamous cell carcinoma of skin of other parts of face: Secondary | ICD-10-CM | POA: Diagnosis not present

## 2022-09-10 DIAGNOSIS — C44319 Basal cell carcinoma of skin of other parts of face: Secondary | ICD-10-CM | POA: Diagnosis not present

## 2022-09-10 DIAGNOSIS — C44229 Squamous cell carcinoma of skin of left ear and external auricular canal: Secondary | ICD-10-CM | POA: Diagnosis not present

## 2022-09-14 MED ORDER — METHYLPREDNISOLONE NA SUC (PF) 40 MG IJ SOLR
40.0000 mg | Freq: Once | INTRAMUSCULAR | Status: DC
Start: 2022-09-14 — End: 2023-10-08

## 2022-09-14 NOTE — Addendum Note (Signed)
Addended by: Renelda Loma on: 09/14/2022 01:11 PM   Modules accepted: Orders

## 2022-10-13 ENCOUNTER — Other Ambulatory Visit: Payer: Self-pay | Admitting: Nurse Practitioner

## 2022-10-23 DIAGNOSIS — C44329 Squamous cell carcinoma of skin of other parts of face: Secondary | ICD-10-CM | POA: Diagnosis not present

## 2022-11-06 DIAGNOSIS — C44229 Squamous cell carcinoma of skin of left ear and external auricular canal: Secondary | ICD-10-CM | POA: Diagnosis not present

## 2022-12-10 DIAGNOSIS — Z789 Other specified health status: Secondary | ICD-10-CM | POA: Diagnosis not present

## 2022-12-10 DIAGNOSIS — D225 Melanocytic nevi of trunk: Secondary | ICD-10-CM | POA: Diagnosis not present

## 2022-12-10 DIAGNOSIS — D485 Neoplasm of uncertain behavior of skin: Secondary | ICD-10-CM | POA: Diagnosis not present

## 2022-12-10 DIAGNOSIS — L814 Other melanin hyperpigmentation: Secondary | ICD-10-CM | POA: Diagnosis not present

## 2022-12-10 DIAGNOSIS — C44622 Squamous cell carcinoma of skin of right upper limb, including shoulder: Secondary | ICD-10-CM | POA: Diagnosis not present

## 2022-12-10 DIAGNOSIS — L538 Other specified erythematous conditions: Secondary | ICD-10-CM | POA: Diagnosis not present

## 2022-12-10 DIAGNOSIS — L821 Other seborrheic keratosis: Secondary | ICD-10-CM | POA: Diagnosis not present

## 2022-12-10 DIAGNOSIS — L298 Other pruritus: Secondary | ICD-10-CM | POA: Diagnosis not present

## 2022-12-10 DIAGNOSIS — L820 Inflamed seborrheic keratosis: Secondary | ICD-10-CM | POA: Diagnosis not present

## 2022-12-10 DIAGNOSIS — Z08 Encounter for follow-up examination after completed treatment for malignant neoplasm: Secondary | ICD-10-CM | POA: Diagnosis not present

## 2023-01-21 ENCOUNTER — Other Ambulatory Visit: Payer: Self-pay | Admitting: Nurse Practitioner

## 2023-03-12 ENCOUNTER — Ambulatory Visit: Payer: Self-pay | Admitting: Nurse Practitioner

## 2023-04-28 ENCOUNTER — Other Ambulatory Visit: Payer: Self-pay | Admitting: Nurse Practitioner

## 2023-05-20 DIAGNOSIS — L538 Other specified erythematous conditions: Secondary | ICD-10-CM | POA: Diagnosis not present

## 2023-05-20 DIAGNOSIS — D0421 Carcinoma in situ of skin of right ear and external auricular canal: Secondary | ICD-10-CM | POA: Diagnosis not present

## 2023-05-20 DIAGNOSIS — Z08 Encounter for follow-up examination after completed treatment for malignant neoplasm: Secondary | ICD-10-CM | POA: Diagnosis not present

## 2023-05-20 DIAGNOSIS — Z789 Other specified health status: Secondary | ICD-10-CM | POA: Diagnosis not present

## 2023-05-20 DIAGNOSIS — Z85828 Personal history of other malignant neoplasm of skin: Secondary | ICD-10-CM | POA: Diagnosis not present

## 2023-05-20 DIAGNOSIS — L820 Inflamed seborrheic keratosis: Secondary | ICD-10-CM | POA: Diagnosis not present

## 2023-05-20 DIAGNOSIS — D485 Neoplasm of uncertain behavior of skin: Secondary | ICD-10-CM | POA: Diagnosis not present

## 2023-07-02 DIAGNOSIS — D0421 Carcinoma in situ of skin of right ear and external auricular canal: Secondary | ICD-10-CM | POA: Diagnosis not present

## 2023-07-20 ENCOUNTER — Telehealth: Payer: Self-pay

## 2023-07-20 ENCOUNTER — Other Ambulatory Visit: Payer: Self-pay | Admitting: Nurse Practitioner

## 2023-07-20 DIAGNOSIS — R7303 Prediabetes: Secondary | ICD-10-CM

## 2023-07-20 DIAGNOSIS — Z130 Encounter for screening for diseases of the blood and blood-forming organs and certain disorders involving the immune mechanism: Secondary | ICD-10-CM

## 2023-07-20 DIAGNOSIS — E785 Hyperlipidemia, unspecified: Secondary | ICD-10-CM

## 2023-07-20 NOTE — Telephone Encounter (Signed)
 Copied from CRM [id number]. Topic: Clinical - Request for Lab/Test Order >> Jul 20, 2023  2:07 PM Elle L wrote: Reason for CRM:  Shelly Flatten called on behalf of the patient to schedule his annual physical. She is requesting lab orders before the patient for his cholesterol and liver. Her call back number is [phone] if needed.  Please advise [facility]

## 2023-07-22 NOTE — Telephone Encounter (Signed)
 Done [facility]

## 2023-08-19 ENCOUNTER — Other Ambulatory Visit: Payer: Self-pay

## 2023-08-19 DIAGNOSIS — E785 Hyperlipidemia, unspecified: Secondary | ICD-10-CM

## 2023-08-19 DIAGNOSIS — Z130 Encounter for screening for diseases of the blood and blood-forming organs and certain disorders involving the immune mechanism: Secondary | ICD-10-CM

## 2023-08-19 DIAGNOSIS — R7303 Prediabetes: Secondary | ICD-10-CM | POA: Diagnosis not present

## 2023-08-19 DIAGNOSIS — Z1329 Encounter for screening for other suspected endocrine disorder: Secondary | ICD-10-CM | POA: Diagnosis not present

## 2023-08-19 DIAGNOSIS — Z1321 Encounter for screening for nutritional disorder: Secondary | ICD-10-CM | POA: Diagnosis not present

## 2023-08-20 ENCOUNTER — Encounter (HOSPITAL_COMMUNITY): Payer: Self-pay

## 2023-08-20 LAB — CMP14+EGFR
ALT: 52 IU/L — ABNORMAL HIGH (ref 0–44)
AST: 49 IU/L — ABNORMAL HIGH (ref 0–40)
Albumin: 3.8 g/dL (ref 3.8–4.9)
Alkaline Phosphatase: 76 IU/L (ref 44–121)
BUN/Creatinine Ratio: 7 — ABNORMAL LOW (ref 9–20)
BUN: 8 mg/dL (ref 6–24)
Bilirubin Total: 0.3 mg/dL (ref 0.0–1.2)
CO2: 19 mmol/L — ABNORMAL LOW (ref 20–29)
Calcium: 9.4 mg/dL (ref 8.7–10.2)
Chloride: 101 mmol/L (ref 96–106)
Creatinine, Ser: 1.12 mg/dL (ref 0.76–1.27)
Globulin, Total: 2.7 g/dL (ref 1.5–4.5)
Glucose: 125 mg/dL — ABNORMAL HIGH (ref 70–99)
Potassium: 3.6 mmol/L (ref 3.5–5.2)
Sodium: 141 mmol/L (ref 134–144)
Total Protein: 6.5 g/dL (ref 6.0–8.5)
eGFR: 78 mL/min/{1.73_m2} (ref >59)

## 2023-08-20 LAB — LIPID PANEL
Chol/HDL Ratio: 2.9 ratio (ref 0.0–5.0)
Cholesterol, Total: 176 mg/dL (ref 100–199)
HDL: 60 mg/dL (ref >39)
LDL Chol Calc (NIH): 91 mg/dL (ref 0–99)
Triglycerides: 144 mg/dL (ref 0–149)
VLDL Cholesterol Cal: 25 mg/dL (ref 5–40)

## 2023-08-20 LAB — CBC
Hematocrit: 47.9 % (ref 37.5–51.0)
Hemoglobin: 16.5 g/dL (ref 13.0–17.7)
MCH: 32.5 pg (ref 26.6–33.0)
MCHC: 34.4 g/dL (ref 31.5–35.7)
MCV: 95 fL (ref 79–97)
Platelets: 263 10*3/uL (ref 150–450)
RBC: 5.07 x10E6/uL (ref 4.14–5.80)
RDW: 12.5 % (ref 11.6–15.4)
WBC: 10.9 10*3/uL — ABNORMAL HIGH (ref 3.4–10.8)

## 2023-08-20 LAB — HEMOGLOBIN A1C
Est. average glucose Bld gHb Est-mCnc: 123 mg/dL
Hgb A1c MFr Bld: 5.9 % — ABNORMAL HIGH (ref 4.8–5.6)

## 2023-08-27 ENCOUNTER — Encounter: Payer: Self-pay | Admitting: Nurse Practitioner

## 2023-08-27 ENCOUNTER — Ambulatory Visit (HOSPITAL_COMMUNITY)
Admission: RE | Admit: 2023-08-27 | Discharge: 2023-08-27 | Disposition: A | Discharge status: Home / Self Care | Source: Ambulatory Visit | Attending: Nurse Practitioner | Admitting: Nurse Practitioner

## 2023-08-27 ENCOUNTER — Ambulatory Visit: Payer: Self-pay | Admitting: Nurse Practitioner

## 2023-08-27 VITALS — BP 108/64 | HR 81 | Temp 97.6°F | Wt 174.0 lb

## 2023-08-27 DIAGNOSIS — Z125 Encounter for screening for malignant neoplasm of prostate: Secondary | ICD-10-CM

## 2023-08-27 DIAGNOSIS — Z23 Encounter for immunization: Secondary | ICD-10-CM

## 2023-08-27 DIAGNOSIS — R748 Abnormal levels of other serum enzymes: Secondary | ICD-10-CM | POA: Diagnosis not present

## 2023-08-27 DIAGNOSIS — F1290 Cannabis use, unspecified, uncomplicated: Secondary | ICD-10-CM

## 2023-08-27 DIAGNOSIS — R0602 Shortness of breath: Secondary | ICD-10-CM | POA: Insufficient documentation

## 2023-08-27 DIAGNOSIS — G4700 Insomnia, unspecified: Secondary | ICD-10-CM | POA: Diagnosis not present

## 2023-08-27 DIAGNOSIS — F1010 Alcohol abuse, uncomplicated: Secondary | ICD-10-CM

## 2023-08-27 DIAGNOSIS — R5383 Other fatigue: Secondary | ICD-10-CM | POA: Diagnosis not present

## 2023-08-27 DIAGNOSIS — Z0000 Encounter for general adult medical examination without abnormal findings: Secondary | ICD-10-CM

## 2023-08-27 DIAGNOSIS — C4490 Unspecified malignant neoplasm of skin, unspecified: Secondary | ICD-10-CM | POA: Insufficient documentation

## 2023-08-27 DIAGNOSIS — I6523 Occlusion and stenosis of bilateral carotid arteries: Secondary | ICD-10-CM

## 2023-08-27 HISTORY — DX: Unspecified malignant neoplasm of skin, unspecified: C44.90

## 2023-08-27 HISTORY — DX: Occlusion and stenosis of bilateral carotid arteries: I65.23

## 2023-08-27 MED ORDER — ROSUVASTATIN CALCIUM 5 MG PO TABS
5.0000 mg | ORAL_TABLET | Freq: Every day | ORAL | 1 refills | Status: AC
Start: 2023-08-27 — End: 2024-08-26

## 2023-08-27 MED ORDER — ASPIRIN 81 MG PO TBEC
81.0000 mg | DELAYED_RELEASE_TABLET | Freq: Every day | ORAL | Status: AC
Start: 2023-08-27 — End: ?

## 2023-08-27 MED ORDER — NALTREXONE HCL 50 MG PO TABS
50.0000 mg | ORAL_TABLET | Freq: Every day | ORAL | 1 refills | Status: AC
Start: 2023-08-27 — End: ?

## 2023-08-27 NOTE — Addendum Note (Signed)
 Addended by: Julian Obey on: 08/27/2023 04:57 PM   Modules accepted: Orders

## 2023-08-27 NOTE — Assessment & Plan Note (Signed)
 Could be related to drinking excess alcohol and especially at night ,need to avoid drinking alcohol discussed Epworth sleepiness scale is 6 Patient referred to pulmonology

## 2023-08-27 NOTE — Progress Notes (Signed)
 Complete physical exam  Patient: Johnny Cook   DOB: 1966-02-05   58 y.o. Male  MRN: [id number]  Subjective:     Chief Complaint  Patient presents with   Annual Exam    fasting    Johnny Cook is a 58 y.o. male  has a past medical history of Alcohol abuse, daily use (01/14/2022), Allergy, Bilateral carotid artery stenosis (08/27/2023), Cancer (HCC), GERD (gastroesophageal reflux disease), Prediabetes (02/27/2022), and Skin cancer (08/27/2023). who presents today for a complete physical exam. He reports consuming a general diet. The patient has a physically strenuous job, but has no regular exercise apart from work.  He generally feels tired a lot He reports sleeping poorly.   Alcohol use disorder/insomnia .  Drinks 4-5 beers nightly.  Has trouble sleeping, states that he gets about 4 hours of sleep nightly He reports snoring does not know if he quits breathing in his sleep,   Fatigue.  Patient reports fatigue, shortness of breath, stated that he has had this symptoms for a long time but it is now getting worse.  Used to smoke cigarettes in his teenage years.  States that he gets short of breath climbing stairs, walking through the parking lot or when working in the yard.  Has occasional very slight chest pain while at work, chest pain usually last less than a minute, states that this does not bother him.  Grief . his son died in 2023-05-18 of this year due to drug overdose, the mother of his child also died in 03/17/2024 last year.  Patient denies SI, HI.  He is not interested in grief counseling     Most recent fall risk assessment:    08/12/2022    1:14 PM  Fall Risk   Falls in the past year? 0  Number falls in past yr: 0  Injury with Fall? 0  Risk for fall due to : No Fall Risks  Follow up Falls evaluation completed     Most recent depression screenings:    08/27/2023    8:45 AM 07/06/2022    1:54 PM  PHQ 2/9 Scores  PHQ - 2 Score 0 0        Patient Care  Team: Melissia Lahman R, FNP as PCP - General (Nurse Practitioner)   Outpatient Medications Prior to Visit  Medication Sig   ibuprofen  (ADVIL ) 800 MG tablet Take 1 tablet (800 mg total) by mouth every 8 (eight) hours as needed. (Patient not taking: Reported on 08/27/2023)   [DISCONTINUED] rosuvastatin  (CRESTOR ) 5 MG tablet TAKE 1 TABLET (5 MG TOTAL) BY MOUTH DAILY. (Patient not taking: Reported on 08/27/2023)   Facility-Administered Medications Prior to Visit  Medication Dose Route Frequency Provider   methylPREDNISolone  sodium succinate (SOLU-MEDROL ) 40 MG injection 40 mg  40 mg Intramuscular Once Esaw Knippel R, FNP    Review of Systems  Constitutional:  Negative for appetite change, chills, fatigue and fever.  HENT:  Negative for congestion, postnasal drip, rhinorrhea and sneezing.   Eyes:  Negative for pain, discharge and itching.  Respiratory:  Positive for shortness of breath. Negative for cough and wheezing.   Cardiovascular:  Negative for chest pain, palpitations and leg swelling.  Gastrointestinal:  Negative for abdominal pain, constipation, nausea and vomiting.  Endocrine: Negative for cold intolerance, heat intolerance and polydipsia.  Genitourinary:  Negative for difficulty urinating, dysuria, flank pain and frequency.  Musculoskeletal:  Negative for arthralgias, back pain, joint swelling and myalgias.  Skin:  Negative for color  change, pallor, rash and wound.  Neurological:  Negative for dizziness, facial asymmetry, weakness, numbness and headaches.  Psychiatric/Behavioral:  Negative for behavioral problems, confusion, self-injury and suicidal ideas.        Objective:     BP 108/64   Pulse 81   Temp 97.6 F (36.4 C)   Wt 174 lb (78.9 kg)   SpO2 95%   BMI 25.70 kg/m    Physical Exam Vitals and nursing note reviewed.  Constitutional:      General: He is not in acute distress.    Appearance: Normal appearance. He is not ill-appearing, toxic-appearing or  diaphoretic.  HENT:     Right Ear: Tympanic membrane, ear canal and external ear normal. There is no impacted cerumen.     Left Ear: Tympanic membrane, ear canal and external ear normal. There is no impacted cerumen.     Nose: Nose normal. No congestion or rhinorrhea.     Mouth/Throat:     Mouth: Mucous membranes are moist.     Pharynx: Oropharynx is clear. No oropharyngeal exudate or posterior oropharyngeal erythema.  Eyes:     General: No scleral icterus.       Right eye: No discharge.        Left eye: No discharge.     Extraocular Movements: Extraocular movements intact.     Conjunctiva/sclera: Conjunctivae normal.  Neck:     Vascular: No carotid bruit.  Cardiovascular:     Rate and Rhythm: Normal rate and regular rhythm.     Pulses: Normal pulses.     Heart sounds: Normal heart sounds. No murmur heard.    No friction rub. No gallop.  Pulmonary:     Effort: Pulmonary effort is normal. No respiratory distress.     Breath sounds: Normal breath sounds. No stridor. No wheezing, rhonchi or rales.  Chest:     Chest wall: No tenderness.  Abdominal:     General: Bowel sounds are normal. There is no distension.     Palpations: Abdomen is soft. There is no mass.     Tenderness: There is no abdominal tenderness. There is no right CVA tenderness, left CVA tenderness, guarding or rebound.     Hernia: No hernia is present.  Musculoskeletal:        General: No swelling, tenderness, deformity or signs of injury.     Cervical back: Normal range of motion and neck supple. No rigidity or tenderness.     Right lower leg: No edema.     Left lower leg: No edema.  Lymphadenopathy:     Cervical: No cervical adenopathy.  Skin:    General: Skin is warm and dry.     Capillary Refill: Capillary refill takes less than 2 seconds.     Coloration: Skin is not jaundiced or pale.     Findings: No bruising, erythema, lesion or rash.     Comments: Appears flushed  Neurological:     Mental Status: He is  alert and oriented to person, place, and time.     Cranial Nerves: No cranial nerve deficit.     Sensory: No sensory deficit.     Motor: No weakness.     Coordination: Coordination normal.     Gait: Gait normal.     Deep Tendon Reflexes: Reflexes normal.  Psychiatric:        Mood and Affect: Mood normal.        Behavior: Behavior normal.        Thought Content:  Thought content normal.        Judgment: Judgment normal.     No results found for any visits on 08/27/23.     Assessment & Plan:    Routine Health Maintenance and Physical Exam  Immunization History  Administered Date(s) Administered   Influenza,inj,Quad PF,6+ Mos 07/06/2022   PFIZER(Purple Top)SARS-COV-2 Vaccination 07/06/2019, 07/27/2019   Tdap 07/06/2022    Health Maintenance  Topic Date Due   Zoster Vaccines- Shingrix (1 of 2) Never done   COVID-19 Vaccine (3 - 2024-25 season) 12/13/2022   INFLUENZA VACCINE  11/12/2023   Colonoscopy  04/04/2027   DTaP/Tdap/Td (2 - Td or Tdap) 07/05/2032   Hepatitis C Screening  Completed   HIV Screening  Completed   HPV VACCINES  Aged Out   Meningococcal B Vaccine  Aged Out    Discussed health benefits of physical activity, and encouraged him to engage in regular exercise appropriate for his age and condition.  Problem List Items Addressed This Visit       Cardiovascular and Mediastinum   Bilateral carotid artery stenosis   Lab Results  Component Value Date   CHOL 176 08/19/2023   HDL 60 08/19/2023   LDLCALC 91 08/19/2023   TRIG 144 08/19/2023   CHOLHDL 2.9 08/19/2023  He has been out of Crestor  and not taking aspirin Crestor  5 mg daily refilled Encouraged to start taking aspirin 81 mg daily Continue to abstain from smoking cigarettes       Relevant Medications   aspirin EC 81 MG tablet   rosuvastatin  (CRESTOR ) 5 MG tablet     Musculoskeletal and Integument   Skin cancer   Followed by dermatology      Relevant Medications   aspirin EC 81 MG tablet      Other   Alcohol abuse, daily use   Need to quit drinking alcohol discussed, He declined referral for alcohol use disorder counseling Patient is interested in quitting alcohol Start naltrexone 50 mg daily, side effect of medication reviewed Follow-up in 4 weeks      Relevant Medications   naltrexone (DEPADE) 50 MG tablet   Marijuana user   Need to quit smoking marijuana discussed with the patient      Annual physical exam - Primary   Annual exam as documented.  Counseling done include healthy lifestyle involving committing to 150 minutes of exercise per week, heart healthy diet, and attaining healthy weight. The importance of adequate sleep also discussed.  Regular use of seat belt and home safety were also discussed . Changes in health habits are decided on by patient with goals and time frames set for achieving them. Immunization and cancer screening  needs are specifically addressed at this visit.    First dose of shingles vaccine given in the office today      Insomnia   Could be related to drinking excess alcohol and especially at night ,need to avoid drinking alcohol discussed Epworth sleepiness scale is 6 Patient referred to pulmonology      Relevant Orders   Ambulatory referral to Pulmonology   Shortness of breath   Echocardiogram done last year showed normal EF 60 to 65% , normal pulmonary artery systolic pressure chest x-ray ordered Patient referred to pulmonology Patient encouraged to quit smoking cigarettes      Relevant Orders   PSA   DG Chest 2 View   Ambulatory referral to Pulmonology   Fatigue   Importance of adequate rest discussed Lab Results  Component Value Date  WBC 10.9 (H) 08/19/2023   HGB 16.5 08/19/2023   HCT 47.9 08/19/2023   MCV 95 08/19/2023   PLT 263 08/19/2023         Relevant Orders   Brain natriuretic peptide   VITAMIN D  25 Hydroxy (Vit-D Deficiency, Fractures)   TSH   Need for shingles vaccine   Patient educated on CDC  recommendation for the vaccine. Verbal consent was obtained from the patient, vaccine administered by nurse, no sign of adverse reactions noted at this time. Patient education on arm soreness and use of tylenol  for this patient  was discussed. Patient educated on the signs and symptoms of adverse effect and advise to contact the office if they occur.       Elevated liver enzymes   Lab Results  Component Value Date   ALT 52 (H) 08/19/2023   AST 49 (H) 08/19/2023   ALKPHOS 76 08/19/2023   BILITOT 0.3 08/19/2023  Patient encouraged to quit drinking alcohol       Relevant Orders   Gamma GT   Other Visit Diagnoses       Screening for prostate cancer          Return in about 2 months (around 10/27/2023) for Alcohol use disorder.     Alean Kromer R Bryne Lindon, FNP

## 2023-08-27 NOTE — Assessment & Plan Note (Signed)
 Followed by dermatology

## 2023-08-27 NOTE — Assessment & Plan Note (Signed)
 Patient educated on CDC recommendation for the vaccine. Verbal consent was obtained from the patient, vaccine administered by nurse, no sign of adverse reactions noted at this time. Patient education on arm soreness and use of tylenol  for this patient  was discussed. Patient educated on the signs and symptoms of adverse effect and advise to contact the office if they occur.  ?

## 2023-08-27 NOTE — Patient Instructions (Addendum)
 1. Annual physical exam (Primary)   2. Bilateral carotid artery stenosis  - aspirin EC 81 MG tablet; Take 1 tablet (81 mg total) by mouth daily. Swallow whole. - rosuvastatin  (CRESTOR ) 5 MG tablet; Take 1 tablet (5 mg total) by mouth daily.  Dispense: 90 tablet; Refill: 1     Please maintain simple sleep hygiene. - Maintain dark and non-noisy environment in the bedroom. - Please use the bedroom for sleep and sexual activity only. - Do not use electronic devices in the bedroom. - Please take dinner at least 2 hours before bedtime. - Please avoid caffeinated products in the evening, including coffee, soft drinks. Avoid alcohol  - Please try to maintain the regular sleep-wake cycle - Go to bed and wake up at the same time.    It is important that you exercise regularly at least 30 minutes 5 times a week as tolerated  Think about what you will eat, plan ahead. Choose " clean, green, fresh or frozen" over canned, processed or packaged foods which are more sugary, salty and fatty. 70 to 75% of food eaten should be vegetables and fruit. Three meals at set times with snacks allowed between meals, but they must be fruit or vegetables. Aim to eat over a 12 hour period , example 7 am to 7 pm, and STOP after  your last meal of the day. Drink water,generally about 64 ounces per day, no other drink is as healthy. Fruit juice is best enjoyed in a healthy way, by EATING the fruit.  Thanks for choosing [facility] we consider it a privelige to serve you.

## 2023-08-27 NOTE — Assessment & Plan Note (Addendum)
 Need to quit drinking alcohol discussed, He declined referral for alcohol use disorder counseling Patient is interested in quitting alcohol Start naltrexone 50 mg daily, side effect of medication reviewed Follow-up in 4 weeks

## 2023-08-27 NOTE — Assessment & Plan Note (Addendum)
 Echocardiogram done last year showed normal EF 60 to 65% , normal pulmonary artery systolic pressure chest x-ray ordered Patient referred to pulmonology Patient encouraged to quit smoking cigarettes

## 2023-08-27 NOTE — Assessment & Plan Note (Addendum)
 Lab Results  Component Value Date   CHOL 176 08/19/2023   HDL 60 08/19/2023   LDLCALC 91 08/19/2023   TRIG 144 08/19/2023   CHOLHDL 2.9 08/19/2023  He has been out of Crestor  and not taking aspirin Crestor  5 mg daily refilled Encouraged to start taking aspirin 81 mg daily Continue to abstain from smoking cigarettes

## 2023-08-27 NOTE — Assessment & Plan Note (Signed)
 Importance of adequate rest discussed Lab Results  Component Value Date   WBC 10.9 (H) 08/19/2023   HGB 16.5 08/19/2023   HCT 47.9 08/19/2023   MCV 95 08/19/2023   PLT 263 08/19/2023

## 2023-08-27 NOTE — Assessment & Plan Note (Signed)
 Lab Results  Component Value Date   ALT 52 (H) 08/19/2023   AST 49 (H) 08/19/2023   ALKPHOS 76 08/19/2023   BILITOT 0.3 08/19/2023  Patient encouraged to quit drinking alcohol

## 2023-08-27 NOTE — Assessment & Plan Note (Signed)
 Annual exam as documented.  Counseling done include healthy lifestyle involving committing to 150 minutes of exercise per week, heart healthy diet, and attaining healthy weight. The importance of adequate sleep also discussed.  Regular use of seat belt and home safety were also discussed . Changes in health habits are decided on by patient with goals and time frames set for achieving them. Immunization and cancer screening  needs are specifically addressed at this visit.    First dose of shingles vaccine given in the office today

## 2023-08-27 NOTE — Assessment & Plan Note (Signed)
Need to quit smoking marijuana discussed with the patient

## 2023-08-30 ENCOUNTER — Other Ambulatory Visit: Payer: Self-pay | Admitting: Nurse Practitioner

## 2023-08-30 DIAGNOSIS — R748 Abnormal levels of other serum enzymes: Secondary | ICD-10-CM

## 2023-08-30 LAB — PSA: Prostate Specific Ag, Serum: 3.1 ng/mL (ref 0.0–4.0)

## 2023-08-30 LAB — TSH: TSH: 1.45 u[IU]/mL (ref 0.450–4.500)

## 2023-08-30 LAB — VITAMIN D 25 HYDROXY (VIT D DEFICIENCY, FRACTURES): Vit D, 25-Hydroxy: 24.5 ng/mL — ABNORMAL LOW (ref 30.0–100.0)

## 2023-08-30 LAB — BRAIN NATRIURETIC PEPTIDE: BNP: 16.6 pg/mL (ref 0.0–100.0)

## 2023-08-30 LAB — GAMMA GT: GGT: 214 IU/L — ABNORMAL HIGH (ref 0–65)

## 2023-09-03 ENCOUNTER — Ambulatory Visit
Admission: RE | Admit: 2023-09-03 | Discharge: 2023-09-03 | Disposition: A | Discharge status: Home / Self Care | Source: Ambulatory Visit | Attending: Nurse Practitioner | Admitting: Nurse Practitioner

## 2023-09-03 ENCOUNTER — Ambulatory Visit: Payer: Self-pay | Admitting: Nurse Practitioner

## 2023-09-03 DIAGNOSIS — R748 Abnormal levels of other serum enzymes: Secondary | ICD-10-CM

## 2023-09-03 DIAGNOSIS — R945 Abnormal results of liver function studies: Secondary | ICD-10-CM | POA: Diagnosis not present

## 2023-10-08 ENCOUNTER — Encounter: Payer: Self-pay | Admitting: Nurse Practitioner

## 2023-10-08 ENCOUNTER — Ambulatory Visit: Payer: Self-pay | Admitting: Nurse Practitioner

## 2023-10-08 VITALS — BP 135/87 | HR 70 | Ht 69.0 in | Wt 167.0 lb

## 2023-10-08 DIAGNOSIS — F1010 Alcohol abuse, uncomplicated: Secondary | ICD-10-CM

## 2023-10-08 DIAGNOSIS — R0602 Shortness of breath: Secondary | ICD-10-CM

## 2023-10-08 DIAGNOSIS — R748 Abnormal levels of other serum enzymes: Secondary | ICD-10-CM | POA: Diagnosis not present

## 2023-10-08 DIAGNOSIS — I6523 Occlusion and stenosis of bilateral carotid arteries: Secondary | ICD-10-CM

## 2023-10-08 NOTE — Progress Notes (Signed)
 Established Patient Office Visit  Subjective:  Patient ID: Johnny Cook, male    DOB: Mar 08, 1966  Age: 58 y.o. MRN: [id number]  CC:  Chief Complaint  Patient presents with   Follow-up    HPI Johnny Cook is a 58 y.o. male  has a past medical history of Alcohol abuse, daily use (01/14/2022), Allergy, Bilateral carotid artery stenosis (08/27/2023), Cancer (HCC), GERD (gastroesophageal reflux disease), Prediabetes (02/27/2022), and Skin cancer (08/27/2023).  Patient presents for follow-up for his chronic medical conditions  Alcohol use disorder.  Was started on naltrexone  50 mg daily during his last visit but he has not been taking the medication, he has been cutting back on drinking, now drinks 2-3 beer, daily he was drinking about 6 beers daily before.  GTT and liver enzymes were elevated, liver ultrasound showed  Increased echotexture of the liver a nonspecific finding but can be seen in fatty infiltration of liver.  Patient denies abdominal pain nausea, vomiting.  He was referred to GI and he plans to call GI to follow-up    Past Medical History:  Diagnosis Date   Alcohol abuse, daily use 01/14/2022   Allergy    SEASONAL   Bilateral carotid artery stenosis 08/27/2023   Cancer (HCC)    SKIN   GERD (gastroesophageal reflux disease)    Prediabetes 02/27/2022   Skin cancer 08/27/2023    Past Surgical History:  Procedure Laterality Date   EYE SURGERY Bilateral    CATRACTS   skin cancer removal Right 03/17/2022   skin cancer removal RLL    Family History  Problem Relation Age of Onset   Skin cancer Mother    Brain cancer Father    Colon cancer Neg Hx    Colon polyps Neg Hx    Crohn's disease Neg Hx    Esophageal cancer Neg Hx    Rectal cancer Neg Hx    Stomach cancer Neg Hx    Ulcerative colitis Neg Hx     Social History   Socioeconomic History   Marital status: Single    Spouse name: Not on file   Number of children: 0   Years of education: Not on  file   Highest education level: Not on file  Occupational History   Not on file  Tobacco Use   Smoking status: Never    Passive exposure: Current   Smokeless tobacco: Never  Vaping Use   Vaping status: Never Used  Substance and Sexual Activity   Alcohol use: Yes    Comment: 3-4 beers per day   Drug use: Yes    Types: Marijuana    Comment: occasionally, last used about 3 days ago   Sexual activity: Yes  Other Topics Concern   Not on file  Social History Narrative   Live with his room mate   Social Drivers of Health   Financial Resource Strain: Low Risk  (02/13/2022)   Overall Financial Resource Strain (CARDIA)    Difficulty of Paying Living Expenses: Not hard at all  Food Insecurity: No Food Insecurity (02/13/2022)   Hunger Vital Sign    Worried About Running Out of Food in the Last Year: Never true    Ran Out of Food in the Last Year: Never true  Transportation Needs: No Transportation Needs (02/13/2022)   PRAPARE - Administrator, Civil Service (Medical): No    Lack of Transportation (Non-Medical): No  Physical Activity: Sufficiently Active (02/13/2022)   Exercise Vital Sign  Days of Exercise per Week: 4 days    Minutes of Exercise per Session: 150+ min  Stress: No Stress Concern Present (02/13/2022)   [employer] of Occupational Health - Occupational Stress Questionnaire    Feeling of Stress : Not at all  Social Connections: Moderately Integrated (02/13/2022)   Social Connection and Isolation Panel    Frequency of Communication with Friends and Family: More than three times a week    Frequency of Social Gatherings with Friends and Family: Three times a week    Attends Religious Services: 1 to 4 times per year    Active Member of Clubs or Organizations: Yes    Attends Banker Meetings: Never    Marital Status: Never married  Intimate Partner Violence: Not At Risk (02/13/2022)   Humiliation, Afraid, Rape, and Kick questionnaire    Fear of  Current or Ex-Partner: No    Emotionally Abused: No    Physically Abused: No    Sexually Abused: No    Outpatient Medications Prior to Visit  Medication Sig Dispense Refill   aspirin  EC 81 MG tablet Take 1 tablet (81 mg total) by mouth daily. Swallow whole.     ibuprofen  (ADVIL ) 800 MG tablet Take 1 tablet (800 mg total) by mouth every 8 (eight) hours as needed. (Patient taking differently: Take 1 tablet (800 mg total) by mouth every 8 (eight) hours as needed.) 30 tablet 0   rosuvastatin  (CRESTOR ) 5 MG tablet Take 1 tablet (5 mg total) by mouth daily. 90 tablet 1   naltrexone  (DEPADE) 50 MG tablet Take 1 tablet (50 mg total) by mouth daily. (Patient not taking: Reported on 10/08/2023) 60 tablet 1   methylPREDNISolone  sodium succinate (SOLU-MEDROL ) 40 MG injection 40 mg      No facility-administered medications prior to visit.    No Known Allergies  ROS Review of Systems  Constitutional:  Negative for appetite change, chills, fatigue and fever.  HENT:  Negative for congestion, postnasal drip, rhinorrhea and sneezing.   Respiratory:  Positive for shortness of breath. Negative for cough and wheezing.   Cardiovascular:  Negative for chest pain, palpitations and leg swelling.  Gastrointestinal:  Negative for abdominal pain, constipation, nausea and vomiting.  Genitourinary:  Negative for difficulty urinating, dysuria, flank pain and frequency.  Musculoskeletal:  Negative for arthralgias, back pain, joint swelling and myalgias.  Skin:  Negative for color change, pallor, rash and wound.  Neurological:  Negative for dizziness, facial asymmetry, weakness, numbness and headaches.  Psychiatric/Behavioral:  Negative for behavioral problems, confusion, self-injury and suicidal ideas.       Objective:    Physical Exam Vitals and nursing note reviewed.  Constitutional:      General: He is not in acute distress.    Appearance: Normal appearance. He is not ill-appearing, toxic-appearing or  diaphoretic.   Eyes:     General: No scleral icterus.       Right eye: No discharge.        Left eye: No discharge.     Extraocular Movements: Extraocular movements intact.     Conjunctiva/sclera: Conjunctivae normal.    Cardiovascular:     Rate and Rhythm: Normal rate and regular rhythm.     Pulses: Normal pulses.     Heart sounds: Normal heart sounds. No murmur heard.    No friction rub. No gallop.  Pulmonary:     Effort: Pulmonary effort is normal. No respiratory distress.     Breath sounds: Normal breath sounds. No  stridor. No wheezing, rhonchi or rales.  Chest:     Chest wall: No tenderness.  Abdominal:     General: There is no distension.     Palpations: Abdomen is soft.     Tenderness: There is no abdominal tenderness. There is no right CVA tenderness, left CVA tenderness or guarding.   Musculoskeletal:        General: No swelling, tenderness, deformity or signs of injury.     Right lower leg: No edema.     Left lower leg: No edema.   Skin:    General: Skin is warm and dry.     Capillary Refill: Capillary refill takes less than 2 seconds.     Coloration: Skin is not jaundiced or pale.     Findings: No bruising, erythema or lesion.   Neurological:     Mental Status: He is alert and oriented to person, place, and time.     Motor: No weakness.     Gait: Gait normal.   Psychiatric:        Mood and Affect: Mood normal.        Behavior: Behavior normal.        Thought Content: Thought content normal.        Judgment: Judgment normal.     BP 135/87   Pulse 70   Ht 5' 9 (1.753 m)   Wt 167 lb (75.8 kg)   SpO2 97%   BMI 24.66 kg/m  Wt Readings from Last 3 Encounters:  10/08/23 167 lb (75.8 kg)  08/27/23 174 lb (78.9 kg)  08/12/22 169 lb 6.4 oz (76.8 kg)    Lab Results  Component Value Date   TSH 1.450 08/27/2023   Lab Results  Component Value Date   WBC 10.9 (H) 08/19/2023   HGB 16.5 08/19/2023   HCT 47.9 08/19/2023   MCV 95 08/19/2023   PLT 263  08/19/2023   Lab Results  Component Value Date   NA 141 08/19/2023   K 3.6 08/19/2023   CO2 19 (L) 08/19/2023   GLUCOSE 125 (H) 08/19/2023   BUN 8 08/19/2023   CREATININE 1.12 08/19/2023   BILITOT 0.3 08/19/2023   ALKPHOS 76 08/19/2023   AST 49 (H) 08/19/2023   ALT 52 (H) 08/19/2023   PROT 6.5 08/19/2023   ALBUMIN 3.8 08/19/2023   CALCIUM  9.4 08/19/2023   ANIONGAP 9 04/26/2018   EGFR 78 08/19/2023   Lab Results  Component Value Date   CHOL 176 08/19/2023   Lab Results  Component Value Date   HDL 60 08/19/2023   Lab Results  Component Value Date   LDLCALC 91 08/19/2023   Lab Results  Component Value Date   TRIG 144 08/19/2023   Lab Results  Component Value Date   CHOLHDL 2.9 08/19/2023   Lab Results  Component Value Date   HGBA1C 5.9 (H) 08/19/2023      Assessment & Plan:   Problem List Items Addressed This Visit       Cardiovascular and Mediastinum   Bilateral carotid artery stenosis - Primary   Continue Crestor  5 mg daily, aspirin  81 mg daily Continue to abstain from smoking Lab Results  Component Value Date   CHOL 176 08/19/2023   HDL 60 08/19/2023   LDLCALC 91 08/19/2023   TRIG 144 08/19/2023   CHOLHDL 2.9 08/19/2023           Other   Alcohol abuse, daily use   Cessation encouraged  Shortness of breath   Recent chest x-ray was normal Patient continues to have shortness of breath Pulmonology office phone number provided encouraged to call them to schedule an appointment He has quit smoking cigarettes      Elevated liver enzymes   Advised to follow-up with GI Need to quit drinking alcohol discussed       No orders of the defined types were placed in this encounter.   Follow-up: Return in about 6 months (around 04/08/2024) for HYPERLIPIDEMIA.    Estiven Kohan R Willmer Fellers, FNP

## 2023-10-08 NOTE — Assessment & Plan Note (Signed)
 Recent chest x-ray was normal Patient continues to have shortness of breath Pulmonology office phone number provided encouraged to call them to schedule an appointment He has quit smoking cigarettes

## 2023-10-08 NOTE — Assessment & Plan Note (Signed)
 Cessation encouraged

## 2023-10-08 NOTE — Assessment & Plan Note (Signed)
 Continue Crestor  5 mg daily, aspirin  81 mg daily Continue to abstain from smoking Lab Results  Component Value Date   CHOL 176 08/19/2023   HDL 60 08/19/2023   LDLCALC 91 08/19/2023   TRIG 144 08/19/2023   CHOLHDL 2.9 08/19/2023

## 2023-10-08 NOTE — Patient Instructions (Signed)

## 2023-10-08 NOTE — Assessment & Plan Note (Signed)
 Advised to follow-up with GI Need to quit drinking alcohol discussed

## 2023-10-26 ENCOUNTER — Encounter: Payer: Self-pay | Admitting: Pulmonary Disease

## 2023-10-26 ENCOUNTER — Ambulatory Visit: Admitting: Pulmonary Disease

## 2023-10-26 VITALS — BP 122/79 | HR 86

## 2023-10-26 DIAGNOSIS — Z87891 Personal history of nicotine dependence: Secondary | ICD-10-CM | POA: Diagnosis not present

## 2023-10-26 DIAGNOSIS — R0602 Shortness of breath: Secondary | ICD-10-CM

## 2023-10-26 DIAGNOSIS — G4700 Insomnia, unspecified: Secondary | ICD-10-CM

## 2023-10-26 NOTE — Addendum Note (Signed)
 Addended by: KARNA CONSUELO PARAS on: 10/26/2023 09:21 AM   Modules accepted: Orders

## 2023-10-26 NOTE — Progress Notes (Signed)
 ABDON PETROSKY    [id number]    Oct 13, 1965  Primary Care Physician:Paseda, Folashade R, FNP  Referring Physician: Paseda, Folashade R, FNP [phone] [address] [place],  [place] [address]  Chief complaint:   Shortness of breath over the last few months, 2 to 3 months  HPI:  Has been concerned about his breathing and last to 3 months No specific trigger for the shortness of breath Shortness of breath with most activities even sometimes just bending over will make you short of breath  Did not have any acute problems, no acute infections, no acute illness No recent hospitalization  Occasional rare cough  No underlying lung disease Denies any breathing problems when he was younger  Never smoker, does use marijuana recreationally-about once a week Drinks 3 beers daily-long-term use  Does construction work, works with glass  No history of cardiac problems No chest pains or chest discomfort Rare wheezing  No family history of lung disease known to him, dad did smoke  He states he gets short of breath with walking 100 yards  He does have some arthritis especially around his fingers, elbows  Outpatient Encounter Medications as of 10/26/2023  Medication Sig   aspirin  EC 81 MG tablet Take 1 tablet (81 mg total) by mouth daily. Swallow whole.   ibuprofen  (ADVIL ) 800 MG tablet Take 1 tablet (800 mg total) by mouth every 8 (eight) hours as needed.   rosuvastatin  (CRESTOR ) 5 MG tablet Take 1 tablet (5 mg total) by mouth daily.   naltrexone  (DEPADE) 50 MG tablet Take 1 tablet (50 mg total) by mouth daily. (Patient not taking: Reported on 10/26/2023)   No facility-administered encounter medications on file as of 10/26/2023.    Allergies as of 10/26/2023   (No Known Allergies)    Past Medical History:  Diagnosis Date   Alcohol abuse, daily use 01/14/2022   Allergy    SEASONAL   Bilateral carotid artery stenosis 08/27/2023   Cancer (HCC)    SKIN   GERD  (gastroesophageal reflux disease)    Prediabetes 02/27/2022   Skin cancer 08/27/2023    Past Surgical History:  Procedure Laterality Date   EYE SURGERY Bilateral    CATRACTS   skin cancer removal Right 03/17/2022   skin cancer removal RLL    Family History  Problem Relation Age of Onset   Skin cancer Mother    Brain cancer Father    Colon cancer Neg Hx    Colon polyps Neg Hx    Crohn's disease Neg Hx    Esophageal cancer Neg Hx    Rectal cancer Neg Hx    Stomach cancer Neg Hx    Ulcerative colitis Neg Hx     Social History   Socioeconomic History   Marital status: Single    Spouse name: Not on file   Number of children: 0   Years of education: Not on file   Highest education level: Not on file  Occupational History   Not on file  Tobacco Use   Smoking status: Never    Passive exposure: Current   Smokeless tobacco: Never  Vaping Use   Vaping status: Never Used  Substance and Sexual Activity   Alcohol use: Yes    Comment: 3-4 beers per day   Drug use: Yes    Types: Marijuana    Comment: occasionally, last used about 3 days ago   Sexual activity: Yes  Other Topics Concern  Not on file  Social History Narrative   Live with his room mate   Social Drivers of Health   Financial Resource Strain: Low Risk  (02/13/2022)   Overall Financial Resource Strain (CARDIA)    Difficulty of Paying Living Expenses: Not hard at all  Food Insecurity: No Food Insecurity (02/13/2022)   Hunger Vital Sign    Worried About Running Out of Food in the Last Year: Never true    Ran Out of Food in the Last Year: Never true  Transportation Needs: No Transportation Needs (02/13/2022)   PRAPARE - Administrator, Civil Service (Medical): No    Lack of Transportation (Non-Medical): No  Physical Activity: Sufficiently Active (02/13/2022)   Exercise Vital Sign    Days of Exercise per Week: 4 days    Minutes of Exercise per Session: 150+ min  Stress: No Stress Concern Present  (02/13/2022)   [employer] of Occupational Health - Occupational Stress Questionnaire    Feeling of Stress : Not at all  Social Connections: Moderately Integrated (02/13/2022)   Social Connection and Isolation Panel    Frequency of Communication with Friends and Family: More than three times a week    Frequency of Social Gatherings with Friends and Family: Three times a week    Attends Religious Services: 1 to 4 times per year    Active Member of Clubs or Organizations: Yes    Attends Banker Meetings: Never    Marital Status: Never married  Intimate Partner Violence: Not At Risk (02/13/2022)   Humiliation, Afraid, Rape, and Kick questionnaire    Fear of Current or Ex-Partner: No    Emotionally Abused: No    Physically Abused: No    Sexually Abused: No    Review of Systems  Respiratory:  Positive for shortness of breath.     Vitals:   10/26/23 0826  BP: 122/79  Pulse: 86  SpO2: 93%     Physical Exam Constitutional:      Appearance: Normal appearance.  HENT:     Head: Normocephalic.     Mouth/Throat:     Mouth: Mucous membranes are moist.  Eyes:     General: No scleral icterus. Cardiovascular:     Rate and Rhythm: Normal rate and regular rhythm.     Heart sounds: No murmur heard.    No friction rub.  Pulmonary:     Effort: No respiratory distress.     Breath sounds: No stridor. No wheezing or rhonchi.  Musculoskeletal:     Cervical back: No rigidity or tenderness.  Neurological:     Mental Status: He is alert.  Psychiatric:        Mood and Affect: Mood normal.       10/26/2023    8:00 AM  Results of the Epworth flowsheet  Sitting and reading 0  Watching TV 2  Sitting, inactive in a public place (e.g. a theatre or a meeting) 2  As a passenger in a car for an hour without a break 2  Lying down to rest in the afternoon when circumstances permit 0  Sitting and talking to someone 0  Sitting quietly after a lunch without alcohol 0  In a car,  while stopped for a few minutes in traffic 0  Total score 6     Data Reviewed: Chest x-ray in May 2025 within normal limits-reviewed by myself  Echocardiogram 07/31/2022-normal ejection fraction, no diastolic dysfunction, right-sided cardiac function is normal  Last blood  work was in May 2025, elevated AST ALT  Assessment:  Shortness of breath - No underlying lung disease - Never smoker, marijuana user - Significant alcohol use history - No acute illness prior to onset of noticing symptoms  -Recent chest x-ray within normal limits, recent echocardiogram with no significant abnormality  Possibility of just generalized deconditioning/alcohol use disorder contributing to symptoms  Insomnia  Plan/Recommendations: Schedule patient for pulmonary function test  Graded exercises as tolerated  Follow-up in about 8 weeks  Counseled about the need to cut down on alcohol use, risks with continuing to use marijuana discussed  If PFT is within normal limits, may need to consider repeating echocardiogram  Consider cardiopulmonary exercise testing  Cause of his insomnia is likely multifactorial, alcohol use likely playing a role  Jennet Epley MD [facility] 10/26/2023, 8:32 AM  CC: Paseda, Folashade R, FNP

## 2023-10-26 NOTE — Patient Instructions (Signed)
 Follow-up in 6 to 8 weeks  Schedule for full pulmonary function test  Graded exercise as tolerated  No clear indication for inhalers  Continue to work on quitting significant alcohol use

## 2023-10-27 ENCOUNTER — Ambulatory Visit: Payer: Self-pay | Admitting: Nurse Practitioner

## 2023-11-23 ENCOUNTER — Ambulatory Visit: Admitting: Vascular Surgery

## 2023-11-23 ENCOUNTER — Encounter (HOSPITAL_COMMUNITY)

## 2023-11-24 ENCOUNTER — Other Ambulatory Visit: Payer: Self-pay

## 2023-11-24 DIAGNOSIS — I6523 Occlusion and stenosis of bilateral carotid arteries: Secondary | ICD-10-CM

## 2023-12-27 ENCOUNTER — Ambulatory Visit: Admitting: Pulmonary Disease

## 2023-12-27 ENCOUNTER — Encounter

## 2024-01-04 ENCOUNTER — Ambulatory Visit: Admitting: Vascular Surgery

## 2024-01-04 ENCOUNTER — Ambulatory Visit (HOSPITAL_COMMUNITY)

## 2024-04-14 ENCOUNTER — Ambulatory Visit: Payer: Self-pay | Admitting: Nurse Practitioner
